# Patient Record
Sex: Female | Born: 1960
Health system: Southern US, Community
[De-identification: ages and names within clinical notes are randomized; demographics above are authoritative.]

## PROBLEM LIST (undated history)

## (undated) DIAGNOSIS — C801 Malignant (primary) neoplasm, unspecified: Secondary | ICD-10-CM

## (undated) HISTORY — PX: GALLBLADDER SURGERY: SHX652

## (undated) HISTORY — PX: CHOLECYSTECTOMY: SHX55

## (undated) HISTORY — PX: BACK SURGERY: SHX140

---

## 2006-01-23 ENCOUNTER — Ambulatory Visit: Payer: Self-pay | Admitting: Unknown Physician Specialty

## 2014-09-06 ENCOUNTER — Ambulatory Visit: Payer: Self-pay | Admitting: Gastroenterology

## 2014-12-19 LAB — SURGICAL PATHOLOGY

## 2016-08-27 ENCOUNTER — Other Ambulatory Visit: Payer: Self-pay | Admitting: Obstetrics and Gynecology

## 2016-08-27 DIAGNOSIS — Z1231 Encounter for screening mammogram for malignant neoplasm of breast: Secondary | ICD-10-CM

## 2016-09-10 ENCOUNTER — Ambulatory Visit
Admission: RE | Admit: 2016-09-10 | Discharge: 2016-09-10 | Disposition: A | Discharge status: Home / Self Care | Payer: Commercial Managed Care - PPO | Source: Ambulatory Visit | Attending: Obstetrics and Gynecology | Admitting: Obstetrics and Gynecology

## 2016-09-10 ENCOUNTER — Encounter: Payer: Self-pay | Admitting: Radiology

## 2016-09-10 DIAGNOSIS — Z1231 Encounter for screening mammogram for malignant neoplasm of breast: Secondary | ICD-10-CM | POA: Insufficient documentation

## 2016-09-11 ENCOUNTER — Ambulatory Visit: Admission: RE | Admit: 2016-09-11 | Payer: Self-pay | Source: Ambulatory Visit

## 2016-09-17 ENCOUNTER — Inpatient Hospital Stay
Admission: RE | Admit: 2016-09-17 | Discharge: 2016-09-17 | Disposition: A | Discharge status: Home / Self Care | Payer: Self-pay | Source: Ambulatory Visit | Attending: *Deleted | Admitting: *Deleted

## 2016-09-17 ENCOUNTER — Other Ambulatory Visit: Payer: Self-pay | Admitting: *Deleted

## 2016-09-17 DIAGNOSIS — Z9289 Personal history of other medical treatment: Secondary | ICD-10-CM

## 2017-02-23 ENCOUNTER — Ambulatory Visit
Admission: EM | Admit: 2017-02-23 | Discharge: 2017-02-23 | Disposition: A | Discharge status: Home / Self Care | Payer: Commercial Managed Care - PPO | Attending: Family Medicine | Admitting: Family Medicine

## 2017-02-23 DIAGNOSIS — J0100 Acute maxillary sinusitis, unspecified: Secondary | ICD-10-CM | POA: Diagnosis not present

## 2017-02-23 MED ORDER — PSEUDOEPHEDRINE HCL 60 MG PO TABS: 60.0000 mg | ORAL_TABLET | Freq: Four times a day (QID) | ORAL | 0 refills | Status: DC | PRN

## 2017-02-23 MED ORDER — LEVOFLOXACIN 500 MG PO TABS: 500.0000 mg | ORAL_TABLET | Freq: Every day | ORAL | 0 refills | Status: DC

## 2017-02-23 NOTE — Discharge Instructions (Signed)
Please continue Flonase, recommend a netti pot or nasal saline solution as needed. Discontinue augmentin, start levaquin. Take pseudoephedrine as prescribed.

## 2017-02-23 NOTE — ED Triage Notes (Signed)
Patient complains of severe pain and pressure. Patient states that symptoms started 12 days ago. Patient states that she went to the walk-in clinic on Wednesday and was Rx Augmentin. Patient states that she has worsened since. Patient reports fever, chills, pain and pressure, headache, ear pain, neck stiffness. Patient states that bending over exacerbates symptoms.

## 2017-02-23 NOTE — ED Provider Notes (Signed)
CSN: [id number]     Arrival date & time 02/23/17  1211 History   First MD Initiated Contact with Patient 02/23/17 1222     Chief Complaint  Patient presents with  . Facial Pain   (Consider location/radiation/quality/duration/timing/severity/associated sxs/prior Treatment) HPI  56 year old female presents to the emergency department for evaluation of severe sinus pain and pressure. Symptoms been present for nearly 2 weeks she describes pain and pressure along her maxillary greater than frontal sinus with intermittent chills. Pain and pressure in her face is increased with leaning forward. She has some dental pain. She denies any neck pain, headache, nausea or vomiting. She has been taking Augmentin for 4 days with no improvement. She has been using Flonase with some improvement. She has not been taking a decongestant medications.     History reviewed. No pertinent past medical history. Past Surgical History:  Procedure Laterality Date  . GALLBLADDER SURGERY     Family History  Problem Relation Age of Onset  . Breast cancer Neg Hx    Social History  Substance Use Topics  . Smoking status: Never Smoker  . Smokeless tobacco: Never Used  . Alcohol use No   OB History    No data available     Review of Systems  Constitutional: Positive for chills and fever.  HENT: Positive for congestion, sinus pain and sinus pressure. Negative for ear discharge, facial swelling, nosebleeds, sore throat and tinnitus.   Eyes: Negative for photophobia, pain, discharge, redness and itching.  Respiratory: Negative for cough, choking, chest tightness and shortness of breath.   Cardiovascular: Negative for chest pain.  Musculoskeletal: Negative for back pain and neck pain.  Skin: Negative for wound.  Neurological: Negative for dizziness, light-headedness and headaches.    Allergies  Patient has no known allergies.  Home Medications   Prior to Admission medications   Medication Sig Start Date End  Date Taking? Authorizing Provider  levofloxacin (LEVAQUIN) 500 MG tablet Take 1 tablet (500 mg total) by mouth daily. 02/23/17   Duanne Guess, PA-C  pseudoephedrine (SUDAFED) 60 MG tablet Take 1 tablet (60 mg total) by mouth every 6 (six) hours as needed for congestion. 02/23/17   Duanne Guess, PA-C   Meds Ordered and Administered this Visit  Medications - No data to display  Pulse 97   Temp 98.5 F (36.9 C) (Oral)   Resp 16   Ht 5\' 7"  (1.702 m)   Wt 143 lb (64.9 kg)   SpO2 97%   BMI 22.40 kg/m  No data found.   Physical Exam  Constitutional: She is oriented to person, place, and time. She appears well-developed and well-nourished.  HENT:  Head: Normocephalic and atraumatic.  Right Ear: External ear normal.  Left Ear: External ear normal.  Mouth/Throat: Oropharynx is clear and moist. No oropharyngeal exudate.  Positive frontal and maxillary sinus tenderness to percussion.  Eyes: Conjunctivae and EOM are normal. Pupils are equal, round, and reactive to light.  Neck: Normal range of motion.  Cardiovascular: Normal rate, regular rhythm and normal heart sounds.   Pulmonary/Chest: Effort normal and breath sounds normal. No respiratory distress. She has no wheezes.  Musculoskeletal: Normal range of motion.  Neurological: She is alert and oriented to person, place, and time.  Skin: Skin is warm. No rash noted. No erythema.  Psychiatric: She has a normal mood and affect. Her behavior is normal. Judgment and thought content normal.    Urgent Care Course     Procedures (including critical  care time)  Labs Review Labs Reviewed - No data to display  Imaging Review No results found.   Visual Acuity Review  Right Eye Distance:   Left Eye Distance:   Bilateral Distance:    Right Eye Near:   Left Eye Near:    Bilateral Near:         MDM   1. Acute non-recurrent maxillary sinusitis    56 year old female with frontal and maxillary sinusitis. She will discontinue  Augmentin and start Levaquin, it appears she is feeling first-line treatment. Recommend continuing with Flonase. Discussed use of netti-pot. She will also start over-the-counter decongestant medication. She is educated on signs and symptoms return to clinic for.   Duanne Guess, [place] 02/23/17 1257

## 2017-03-13 DIAGNOSIS — C7931 Secondary malignant neoplasm of brain: Secondary | ICD-10-CM | POA: Insufficient documentation

## 2017-03-13 DIAGNOSIS — R918 Other nonspecific abnormal finding of lung field: Secondary | ICD-10-CM | POA: Insufficient documentation

## 2017-03-20 DIAGNOSIS — C3492 Malignant neoplasm of unspecified part of left bronchus or lung: Secondary | ICD-10-CM | POA: Insufficient documentation

## 2017-04-24 DIAGNOSIS — Z5189 Encounter for other specified aftercare: Secondary | ICD-10-CM | POA: Insufficient documentation

## 2017-04-27 DIAGNOSIS — K754 Autoimmune hepatitis: Secondary | ICD-10-CM | POA: Insufficient documentation

## 2017-04-27 DIAGNOSIS — R7401 Elevation of levels of liver transaminase levels: Secondary | ICD-10-CM | POA: Insufficient documentation

## 2017-05-15 DIAGNOSIS — R600 Localized edema: Secondary | ICD-10-CM | POA: Insufficient documentation

## 2017-05-15 DIAGNOSIS — R609 Edema, unspecified: Secondary | ICD-10-CM | POA: Insufficient documentation

## 2017-08-27 DIAGNOSIS — C3490 Malignant neoplasm of unspecified part of unspecified bronchus or lung: Secondary | ICD-10-CM | POA: Diagnosis not present

## 2017-08-27 DIAGNOSIS — C7931 Secondary malignant neoplasm of brain: Secondary | ICD-10-CM | POA: Diagnosis not present

## 2017-08-27 DIAGNOSIS — Z5112 Encounter for antineoplastic immunotherapy: Secondary | ICD-10-CM | POA: Diagnosis not present

## 2017-08-27 DIAGNOSIS — Z5111 Encounter for antineoplastic chemotherapy: Secondary | ICD-10-CM | POA: Diagnosis not present

## 2017-08-27 DIAGNOSIS — C3492 Malignant neoplasm of unspecified part of left bronchus or lung: Secondary | ICD-10-CM | POA: Diagnosis not present

## 2017-08-28 DIAGNOSIS — C7931 Secondary malignant neoplasm of brain: Secondary | ICD-10-CM | POA: Diagnosis not present

## 2017-08-28 DIAGNOSIS — C3492 Malignant neoplasm of unspecified part of left bronchus or lung: Secondary | ICD-10-CM | POA: Diagnosis not present

## 2017-08-28 DIAGNOSIS — C3432 Malignant neoplasm of lower lobe, left bronchus or lung: Secondary | ICD-10-CM | POA: Diagnosis not present

## 2017-08-28 DIAGNOSIS — Z5111 Encounter for antineoplastic chemotherapy: Secondary | ICD-10-CM | POA: Diagnosis not present

## 2017-08-31 DIAGNOSIS — R197 Diarrhea, unspecified: Secondary | ICD-10-CM | POA: Diagnosis not present

## 2017-08-31 DIAGNOSIS — M6281 Muscle weakness (generalized): Secondary | ICD-10-CM | POA: Diagnosis not present

## 2017-08-31 DIAGNOSIS — R110 Nausea: Secondary | ICD-10-CM | POA: Diagnosis not present

## 2017-09-01 DIAGNOSIS — E878 Other disorders of electrolyte and fluid balance, not elsewhere classified: Secondary | ICD-10-CM | POA: Diagnosis not present

## 2017-09-01 DIAGNOSIS — R109 Unspecified abdominal pain: Secondary | ICD-10-CM | POA: Diagnosis not present

## 2017-09-01 DIAGNOSIS — A419 Sepsis, unspecified organism: Secondary | ICD-10-CM | POA: Diagnosis not present

## 2017-09-01 DIAGNOSIS — C7931 Secondary malignant neoplasm of brain: Secondary | ICD-10-CM | POA: Diagnosis not present

## 2017-09-01 DIAGNOSIS — K5792 Diverticulitis of intestine, part unspecified, without perforation or abscess without bleeding: Secondary | ICD-10-CM | POA: Diagnosis not present

## 2017-09-01 DIAGNOSIS — R112 Nausea with vomiting, unspecified: Secondary | ICD-10-CM | POA: Diagnosis not present

## 2017-09-01 DIAGNOSIS — R6510 Systemic inflammatory response syndrome (SIRS) of non-infectious origin without acute organ dysfunction: Secondary | ICD-10-CM | POA: Diagnosis not present

## 2017-09-01 DIAGNOSIS — K5732 Diverticulitis of large intestine without perforation or abscess without bleeding: Secondary | ICD-10-CM | POA: Diagnosis not present

## 2017-09-01 DIAGNOSIS — T451X5A Adverse effect of antineoplastic and immunosuppressive drugs, initial encounter: Secondary | ICD-10-CM | POA: Diagnosis not present

## 2017-09-01 DIAGNOSIS — C3492 Malignant neoplasm of unspecified part of left bronchus or lung: Secondary | ICD-10-CM | POA: Diagnosis not present

## 2017-09-11 DIAGNOSIS — E876 Hypokalemia: Secondary | ICD-10-CM | POA: Diagnosis not present

## 2017-09-11 DIAGNOSIS — E860 Dehydration: Secondary | ICD-10-CM | POA: Diagnosis not present

## 2017-09-11 DIAGNOSIS — R197 Diarrhea, unspecified: Secondary | ICD-10-CM | POA: Diagnosis not present

## 2017-09-12 DIAGNOSIS — E860 Dehydration: Secondary | ICD-10-CM | POA: Diagnosis not present

## 2017-09-12 DIAGNOSIS — E876 Hypokalemia: Secondary | ICD-10-CM | POA: Diagnosis not present

## 2017-09-12 DIAGNOSIS — R197 Diarrhea, unspecified: Secondary | ICD-10-CM | POA: Diagnosis not present

## 2017-09-18 DIAGNOSIS — C3492 Malignant neoplasm of unspecified part of left bronchus or lung: Secondary | ICD-10-CM | POA: Diagnosis not present

## 2017-09-18 DIAGNOSIS — C3432 Malignant neoplasm of lower lobe, left bronchus or lung: Secondary | ICD-10-CM | POA: Diagnosis not present

## 2017-09-18 DIAGNOSIS — R600 Localized edema: Secondary | ICD-10-CM | POA: Diagnosis not present

## 2017-09-18 DIAGNOSIS — C7931 Secondary malignant neoplasm of brain: Secondary | ICD-10-CM | POA: Diagnosis not present

## 2017-09-25 DIAGNOSIS — Z79899 Other long term (current) drug therapy: Secondary | ICD-10-CM | POA: Diagnosis not present

## 2017-09-25 DIAGNOSIS — R319 Hematuria, unspecified: Secondary | ICD-10-CM | POA: Diagnosis not present

## 2017-09-25 DIAGNOSIS — M1990 Unspecified osteoarthritis, unspecified site: Secondary | ICD-10-CM | POA: Diagnosis not present

## 2017-09-25 DIAGNOSIS — C3492 Malignant neoplasm of unspecified part of left bronchus or lung: Secondary | ICD-10-CM | POA: Diagnosis not present

## 2017-10-09 DIAGNOSIS — R1084 Generalized abdominal pain: Secondary | ICD-10-CM | POA: Diagnosis not present

## 2017-10-09 DIAGNOSIS — R531 Weakness: Secondary | ICD-10-CM | POA: Diagnosis not present

## 2017-10-09 DIAGNOSIS — C3492 Malignant neoplasm of unspecified part of left bronchus or lung: Secondary | ICD-10-CM | POA: Diagnosis not present

## 2017-10-09 DIAGNOSIS — C3432 Malignant neoplasm of lower lobe, left bronchus or lung: Secondary | ICD-10-CM | POA: Diagnosis not present

## 2017-10-09 DIAGNOSIS — Z5111 Encounter for antineoplastic chemotherapy: Secondary | ICD-10-CM | POA: Diagnosis not present

## 2017-10-09 DIAGNOSIS — C7931 Secondary malignant neoplasm of brain: Secondary | ICD-10-CM | POA: Diagnosis not present

## 2017-10-13 DIAGNOSIS — R61 Generalized hyperhidrosis: Secondary | ICD-10-CM | POA: Diagnosis not present

## 2017-10-13 DIAGNOSIS — C3490 Malignant neoplasm of unspecified part of unspecified bronchus or lung: Secondary | ICD-10-CM | POA: Diagnosis not present

## 2017-10-13 DIAGNOSIS — R232 Flushing: Secondary | ICD-10-CM | POA: Diagnosis not present

## 2017-10-17 DIAGNOSIS — Z85118 Personal history of other malignant neoplasm of bronchus and lung: Secondary | ICD-10-CM | POA: Diagnosis not present

## 2017-10-17 DIAGNOSIS — C7931 Secondary malignant neoplasm of brain: Secondary | ICD-10-CM | POA: Diagnosis not present

## 2017-10-17 DIAGNOSIS — Z923 Personal history of irradiation: Secondary | ICD-10-CM | POA: Diagnosis not present

## 2017-10-23 DIAGNOSIS — N362 Urethral caruncle: Secondary | ICD-10-CM | POA: Diagnosis not present

## 2017-10-23 DIAGNOSIS — R319 Hematuria, unspecified: Secondary | ICD-10-CM | POA: Diagnosis not present

## 2017-10-23 DIAGNOSIS — R3129 Other microscopic hematuria: Secondary | ICD-10-CM | POA: Diagnosis not present

## 2017-10-23 DIAGNOSIS — K5900 Constipation, unspecified: Secondary | ICD-10-CM | POA: Diagnosis not present

## 2017-10-30 DIAGNOSIS — C3432 Malignant neoplasm of lower lobe, left bronchus or lung: Secondary | ICD-10-CM | POA: Diagnosis not present

## 2017-10-30 DIAGNOSIS — R1084 Generalized abdominal pain: Secondary | ICD-10-CM | POA: Diagnosis not present

## 2017-10-30 DIAGNOSIS — C3492 Malignant neoplasm of unspecified part of left bronchus or lung: Secondary | ICD-10-CM | POA: Diagnosis not present

## 2017-10-30 DIAGNOSIS — C7931 Secondary malignant neoplasm of brain: Secondary | ICD-10-CM | POA: Diagnosis not present

## 2017-10-30 DIAGNOSIS — R911 Solitary pulmonary nodule: Secondary | ICD-10-CM | POA: Diagnosis not present

## 2017-10-30 DIAGNOSIS — G893 Neoplasm related pain (acute) (chronic): Secondary | ICD-10-CM | POA: Diagnosis not present

## 2017-10-30 DIAGNOSIS — R600 Localized edema: Secondary | ICD-10-CM | POA: Diagnosis not present

## 2017-10-30 DIAGNOSIS — K5732 Diverticulitis of large intestine without perforation or abscess without bleeding: Secondary | ICD-10-CM | POA: Diagnosis not present

## 2017-10-30 DIAGNOSIS — Z87891 Personal history of nicotine dependence: Secondary | ICD-10-CM | POA: Diagnosis not present

## 2017-10-30 DIAGNOSIS — K5792 Diverticulitis of intestine, part unspecified, without perforation or abscess without bleeding: Secondary | ICD-10-CM | POA: Diagnosis not present

## 2017-11-13 DIAGNOSIS — K5904 Chronic idiopathic constipation: Secondary | ICD-10-CM | POA: Diagnosis not present

## 2017-11-13 DIAGNOSIS — R232 Flushing: Secondary | ICD-10-CM | POA: Diagnosis not present

## 2017-11-13 DIAGNOSIS — C3490 Malignant neoplasm of unspecified part of unspecified bronchus or lung: Secondary | ICD-10-CM | POA: Diagnosis not present

## 2017-12-01 DIAGNOSIS — M940 Chondrocostal junction syndrome [Tietze]: Secondary | ICD-10-CM | POA: Diagnosis not present

## 2017-12-01 DIAGNOSIS — S39012A Strain of muscle, fascia and tendon of lower back, initial encounter: Secondary | ICD-10-CM | POA: Diagnosis not present

## 2017-12-09 DIAGNOSIS — R1110 Vomiting, unspecified: Secondary | ICD-10-CM | POA: Diagnosis not present

## 2017-12-09 DIAGNOSIS — R109 Unspecified abdominal pain: Secondary | ICD-10-CM | POA: Diagnosis not present

## 2017-12-09 DIAGNOSIS — K5792 Diverticulitis of intestine, part unspecified, without perforation or abscess without bleeding: Secondary | ICD-10-CM | POA: Diagnosis not present

## 2017-12-12 DIAGNOSIS — C3492 Malignant neoplasm of unspecified part of left bronchus or lung: Secondary | ICD-10-CM | POA: Diagnosis not present

## 2017-12-15 DIAGNOSIS — K5904 Chronic idiopathic constipation: Secondary | ICD-10-CM | POA: Diagnosis not present

## 2017-12-15 DIAGNOSIS — R232 Flushing: Secondary | ICD-10-CM | POA: Diagnosis not present

## 2017-12-25 DIAGNOSIS — M1990 Unspecified osteoarthritis, unspecified site: Secondary | ICD-10-CM | POA: Diagnosis not present

## 2017-12-25 DIAGNOSIS — Z79899 Other long term (current) drug therapy: Secondary | ICD-10-CM | POA: Diagnosis not present

## 2017-12-25 DIAGNOSIS — C3492 Malignant neoplasm of unspecified part of left bronchus or lung: Secondary | ICD-10-CM | POA: Diagnosis not present

## 2018-01-16 DIAGNOSIS — C3490 Malignant neoplasm of unspecified part of unspecified bronchus or lung: Secondary | ICD-10-CM | POA: Diagnosis not present

## 2018-01-16 DIAGNOSIS — C7931 Secondary malignant neoplasm of brain: Secondary | ICD-10-CM | POA: Diagnosis not present

## 2018-01-29 DIAGNOSIS — C7931 Secondary malignant neoplasm of brain: Secondary | ICD-10-CM | POA: Diagnosis not present

## 2018-01-29 DIAGNOSIS — C3432 Malignant neoplasm of lower lobe, left bronchus or lung: Secondary | ICD-10-CM | POA: Diagnosis not present

## 2018-01-29 DIAGNOSIS — R911 Solitary pulmonary nodule: Secondary | ICD-10-CM | POA: Diagnosis not present

## 2018-01-29 DIAGNOSIS — C3492 Malignant neoplasm of unspecified part of left bronchus or lung: Secondary | ICD-10-CM | POA: Diagnosis not present

## 2018-01-29 DIAGNOSIS — M8580 Other specified disorders of bone density and structure, unspecified site: Secondary | ICD-10-CM | POA: Diagnosis not present

## 2018-01-29 DIAGNOSIS — G629 Polyneuropathy, unspecified: Secondary | ICD-10-CM | POA: Diagnosis not present

## 2018-02-09 DIAGNOSIS — R232 Flushing: Secondary | ICD-10-CM | POA: Diagnosis not present

## 2018-02-09 DIAGNOSIS — R61 Generalized hyperhidrosis: Secondary | ICD-10-CM | POA: Diagnosis not present

## 2018-02-09 DIAGNOSIS — N898 Other specified noninflammatory disorders of vagina: Secondary | ICD-10-CM | POA: Diagnosis not present

## 2018-02-19 DIAGNOSIS — K5904 Chronic idiopathic constipation: Secondary | ICD-10-CM | POA: Diagnosis not present

## 2018-02-19 DIAGNOSIS — E538 Deficiency of other specified B group vitamins: Secondary | ICD-10-CM | POA: Diagnosis not present

## 2018-02-19 DIAGNOSIS — R232 Flushing: Secondary | ICD-10-CM | POA: Diagnosis not present

## 2018-03-13 DIAGNOSIS — C3490 Malignant neoplasm of unspecified part of unspecified bronchus or lung: Secondary | ICD-10-CM | POA: Diagnosis not present

## 2018-03-13 DIAGNOSIS — Z452 Encounter for adjustment and management of vascular access device: Secondary | ICD-10-CM | POA: Diagnosis not present

## 2018-03-13 DIAGNOSIS — R0789 Other chest pain: Secondary | ICD-10-CM | POA: Diagnosis not present

## 2018-03-13 DIAGNOSIS — T82848A Pain from vascular prosthetic devices, implants and grafts, initial encounter: Secondary | ICD-10-CM | POA: Diagnosis not present

## 2018-03-18 DIAGNOSIS — H01009 Unspecified blepharitis unspecified eye, unspecified eyelid: Secondary | ICD-10-CM | POA: Diagnosis not present

## 2018-04-02 DIAGNOSIS — M064 Inflammatory polyarthropathy: Secondary | ICD-10-CM | POA: Diagnosis not present

## 2018-04-02 DIAGNOSIS — C3490 Malignant neoplasm of unspecified part of unspecified bronchus or lung: Secondary | ICD-10-CM | POA: Diagnosis not present

## 2018-04-02 DIAGNOSIS — Z23 Encounter for immunization: Secondary | ICD-10-CM | POA: Diagnosis not present

## 2018-04-02 DIAGNOSIS — T451X5D Adverse effect of antineoplastic and immunosuppressive drugs, subsequent encounter: Secondary | ICD-10-CM | POA: Diagnosis not present

## 2018-04-02 DIAGNOSIS — M7050 Other bursitis of knee, unspecified knee: Secondary | ICD-10-CM | POA: Insufficient documentation

## 2018-04-02 DIAGNOSIS — M1990 Unspecified osteoarthritis, unspecified site: Secondary | ICD-10-CM | POA: Diagnosis not present

## 2018-04-02 DIAGNOSIS — Z5111 Encounter for antineoplastic chemotherapy: Secondary | ICD-10-CM | POA: Diagnosis not present

## 2018-04-29 DIAGNOSIS — H01009 Unspecified blepharitis unspecified eye, unspecified eyelid: Secondary | ICD-10-CM | POA: Diagnosis not present

## 2018-05-07 DIAGNOSIS — M546 Pain in thoracic spine: Secondary | ICD-10-CM | POA: Diagnosis not present

## 2018-05-07 DIAGNOSIS — C7931 Secondary malignant neoplasm of brain: Secondary | ICD-10-CM | POA: Diagnosis not present

## 2018-05-07 DIAGNOSIS — R918 Other nonspecific abnormal finding of lung field: Secondary | ICD-10-CM | POA: Diagnosis not present

## 2018-05-07 DIAGNOSIS — C3490 Malignant neoplasm of unspecified part of unspecified bronchus or lung: Secondary | ICD-10-CM | POA: Diagnosis not present

## 2018-05-07 DIAGNOSIS — C3492 Malignant neoplasm of unspecified part of left bronchus or lung: Secondary | ICD-10-CM | POA: Diagnosis not present

## 2018-05-07 DIAGNOSIS — Z5111 Encounter for antineoplastic chemotherapy: Secondary | ICD-10-CM | POA: Diagnosis not present

## 2018-05-08 DIAGNOSIS — M546 Pain in thoracic spine: Secondary | ICD-10-CM | POA: Diagnosis not present

## 2018-05-08 DIAGNOSIS — R110 Nausea: Secondary | ICD-10-CM | POA: Diagnosis not present

## 2018-05-13 DIAGNOSIS — C3490 Malignant neoplasm of unspecified part of unspecified bronchus or lung: Secondary | ICD-10-CM | POA: Diagnosis not present

## 2018-05-13 DIAGNOSIS — R6889 Other general symptoms and signs: Secondary | ICD-10-CM | POA: Diagnosis not present

## 2018-05-13 DIAGNOSIS — C7931 Secondary malignant neoplasm of brain: Secondary | ICD-10-CM | POA: Diagnosis not present

## 2018-05-13 DIAGNOSIS — S22009A Unspecified fracture of unspecified thoracic vertebra, initial encounter for closed fracture: Secondary | ICD-10-CM | POA: Diagnosis not present

## 2018-05-13 DIAGNOSIS — M4854XD Collapsed vertebra, not elsewhere classified, thoracic region, subsequent encounter for fracture with routine healing: Secondary | ICD-10-CM | POA: Diagnosis not present

## 2018-05-13 DIAGNOSIS — C7951 Secondary malignant neoplasm of bone: Secondary | ICD-10-CM | POA: Diagnosis not present

## 2018-05-13 DIAGNOSIS — R109 Unspecified abdominal pain: Secondary | ICD-10-CM | POA: Diagnosis not present

## 2018-05-13 DIAGNOSIS — M5440 Lumbago with sciatica, unspecified side: Secondary | ICD-10-CM | POA: Diagnosis not present

## 2018-05-14 DIAGNOSIS — C7931 Secondary malignant neoplasm of brain: Secondary | ICD-10-CM | POA: Diagnosis not present

## 2018-05-14 DIAGNOSIS — C3490 Malignant neoplasm of unspecified part of unspecified bronchus or lung: Secondary | ICD-10-CM | POA: Diagnosis not present

## 2018-05-19 DIAGNOSIS — C7931 Secondary malignant neoplasm of brain: Secondary | ICD-10-CM | POA: Diagnosis not present

## 2018-05-19 DIAGNOSIS — Z85118 Personal history of other malignant neoplasm of bronchus and lung: Secondary | ICD-10-CM | POA: Diagnosis not present

## 2018-05-19 DIAGNOSIS — J910 Malignant pleural effusion: Secondary | ICD-10-CM | POA: Diagnosis not present

## 2018-05-19 DIAGNOSIS — C3490 Malignant neoplasm of unspecified part of unspecified bronchus or lung: Secondary | ICD-10-CM | POA: Diagnosis not present

## 2018-05-19 DIAGNOSIS — J90 Pleural effusion, not elsewhere classified: Secondary | ICD-10-CM | POA: Diagnosis not present

## 2018-05-19 DIAGNOSIS — R918 Other nonspecific abnormal finding of lung field: Secondary | ICD-10-CM | POA: Diagnosis not present

## 2018-05-19 DIAGNOSIS — C3492 Malignant neoplasm of unspecified part of left bronchus or lung: Secondary | ICD-10-CM | POA: Diagnosis not present

## 2018-05-19 DIAGNOSIS — R911 Solitary pulmonary nodule: Secondary | ICD-10-CM | POA: Diagnosis not present

## 2018-05-20 DIAGNOSIS — M546 Pain in thoracic spine: Secondary | ICD-10-CM | POA: Diagnosis not present

## 2018-05-20 DIAGNOSIS — C3492 Malignant neoplasm of unspecified part of left bronchus or lung: Secondary | ICD-10-CM | POA: Diagnosis not present

## 2018-05-20 DIAGNOSIS — C7931 Secondary malignant neoplasm of brain: Secondary | ICD-10-CM | POA: Diagnosis not present

## 2018-05-20 DIAGNOSIS — C3490 Malignant neoplasm of unspecified part of unspecified bronchus or lung: Secondary | ICD-10-CM | POA: Diagnosis not present

## 2018-05-22 DIAGNOSIS — Z923 Personal history of irradiation: Secondary | ICD-10-CM | POA: Diagnosis not present

## 2018-05-22 DIAGNOSIS — M549 Dorsalgia, unspecified: Secondary | ICD-10-CM | POA: Diagnosis not present

## 2018-05-22 DIAGNOSIS — C3490 Malignant neoplasm of unspecified part of unspecified bronchus or lung: Secondary | ICD-10-CM | POA: Diagnosis not present

## 2018-05-22 DIAGNOSIS — J90 Pleural effusion, not elsewhere classified: Secondary | ICD-10-CM | POA: Diagnosis not present

## 2018-05-22 DIAGNOSIS — D649 Anemia, unspecified: Secondary | ICD-10-CM | POA: Diagnosis not present

## 2018-05-22 DIAGNOSIS — S22081A Stable burst fracture of T11-T12 vertebra, initial encounter for closed fracture: Secondary | ICD-10-CM | POA: Diagnosis not present

## 2018-05-22 DIAGNOSIS — C7931 Secondary malignant neoplasm of brain: Secondary | ICD-10-CM | POA: Diagnosis not present

## 2018-05-22 DIAGNOSIS — C801 Malignant (primary) neoplasm, unspecified: Secondary | ICD-10-CM | POA: Diagnosis not present

## 2018-05-29 DIAGNOSIS — C3490 Malignant neoplasm of unspecified part of unspecified bronchus or lung: Secondary | ICD-10-CM | POA: Diagnosis not present

## 2018-05-29 DIAGNOSIS — S22000A Wedge compression fracture of unspecified thoracic vertebra, initial encounter for closed fracture: Secondary | ICD-10-CM | POA: Diagnosis not present

## 2018-05-29 DIAGNOSIS — M546 Pain in thoracic spine: Secondary | ICD-10-CM | POA: Diagnosis not present

## 2018-05-29 DIAGNOSIS — S22080A Wedge compression fracture of T11-T12 vertebra, initial encounter for closed fracture: Secondary | ICD-10-CM | POA: Diagnosis not present

## 2018-05-29 DIAGNOSIS — C7931 Secondary malignant neoplasm of brain: Secondary | ICD-10-CM | POA: Diagnosis not present

## 2018-06-02 DIAGNOSIS — S22088A Other fracture of T11-T12 vertebra, initial encounter for closed fracture: Secondary | ICD-10-CM | POA: Diagnosis not present

## 2018-06-02 DIAGNOSIS — S22080G Wedge compression fracture of T11-T12 vertebra, subsequent encounter for fracture with delayed healing: Secondary | ICD-10-CM | POA: Diagnosis not present

## 2018-06-02 DIAGNOSIS — Z87891 Personal history of nicotine dependence: Secondary | ICD-10-CM | POA: Diagnosis not present

## 2018-06-02 DIAGNOSIS — S22080A Wedge compression fracture of T11-T12 vertebra, initial encounter for closed fracture: Secondary | ICD-10-CM | POA: Diagnosis not present

## 2018-06-05 DIAGNOSIS — R0781 Pleurodynia: Secondary | ICD-10-CM | POA: Diagnosis not present

## 2018-06-05 DIAGNOSIS — R110 Nausea: Secondary | ICD-10-CM | POA: Diagnosis not present

## 2018-06-05 DIAGNOSIS — M8580 Other specified disorders of bone density and structure, unspecified site: Secondary | ICD-10-CM | POA: Diagnosis not present

## 2018-06-08 DIAGNOSIS — G893 Neoplasm related pain (acute) (chronic): Secondary | ICD-10-CM | POA: Diagnosis not present

## 2018-06-08 DIAGNOSIS — E860 Dehydration: Secondary | ICD-10-CM | POA: Diagnosis not present

## 2018-06-08 DIAGNOSIS — R112 Nausea with vomiting, unspecified: Secondary | ICD-10-CM | POA: Diagnosis not present

## 2018-06-08 DIAGNOSIS — C7931 Secondary malignant neoplasm of brain: Secondary | ICD-10-CM | POA: Diagnosis not present

## 2018-06-08 DIAGNOSIS — M546 Pain in thoracic spine: Secondary | ICD-10-CM | POA: Diagnosis not present

## 2018-06-08 DIAGNOSIS — C3492 Malignant neoplasm of unspecified part of left bronchus or lung: Secondary | ICD-10-CM | POA: Diagnosis not present

## 2018-06-15 DIAGNOSIS — C7951 Secondary malignant neoplasm of bone: Secondary | ICD-10-CM | POA: Diagnosis not present

## 2018-06-15 DIAGNOSIS — Z8719 Personal history of other diseases of the digestive system: Secondary | ICD-10-CM | POA: Insufficient documentation

## 2018-06-15 DIAGNOSIS — E41 Nutritional marasmus: Secondary | ICD-10-CM | POA: Diagnosis not present

## 2018-06-15 DIAGNOSIS — S22000A Wedge compression fracture of unspecified thoracic vertebra, initial encounter for closed fracture: Secondary | ICD-10-CM | POA: Diagnosis not present

## 2018-06-18 DIAGNOSIS — I10 Essential (primary) hypertension: Secondary | ICD-10-CM | POA: Diagnosis not present

## 2018-06-18 DIAGNOSIS — C3492 Malignant neoplasm of unspecified part of left bronchus or lung: Secondary | ICD-10-CM | POA: Diagnosis not present

## 2018-06-18 DIAGNOSIS — C7931 Secondary malignant neoplasm of brain: Secondary | ICD-10-CM | POA: Diagnosis not present

## 2018-06-19 DIAGNOSIS — C7931 Secondary malignant neoplasm of brain: Secondary | ICD-10-CM | POA: Diagnosis not present

## 2018-06-19 DIAGNOSIS — C3492 Malignant neoplasm of unspecified part of left bronchus or lung: Secondary | ICD-10-CM | POA: Diagnosis not present

## 2018-06-19 DIAGNOSIS — R112 Nausea with vomiting, unspecified: Secondary | ICD-10-CM | POA: Diagnosis not present

## 2018-06-19 DIAGNOSIS — I10 Essential (primary) hypertension: Secondary | ICD-10-CM | POA: Diagnosis not present

## 2018-06-19 DIAGNOSIS — M546 Pain in thoracic spine: Secondary | ICD-10-CM | POA: Diagnosis not present

## 2018-06-19 DIAGNOSIS — Z08 Encounter for follow-up examination after completed treatment for malignant neoplasm: Secondary | ICD-10-CM | POA: Diagnosis not present

## 2018-06-20 DIAGNOSIS — C3492 Malignant neoplasm of unspecified part of left bronchus or lung: Secondary | ICD-10-CM | POA: Diagnosis not present

## 2018-06-20 DIAGNOSIS — I10 Essential (primary) hypertension: Secondary | ICD-10-CM | POA: Diagnosis not present

## 2018-06-20 DIAGNOSIS — C7931 Secondary malignant neoplasm of brain: Secondary | ICD-10-CM | POA: Diagnosis not present

## 2018-06-21 DIAGNOSIS — C3492 Malignant neoplasm of unspecified part of left bronchus or lung: Secondary | ICD-10-CM | POA: Diagnosis not present

## 2018-06-21 DIAGNOSIS — C7931 Secondary malignant neoplasm of brain: Secondary | ICD-10-CM | POA: Diagnosis not present

## 2018-06-21 DIAGNOSIS — I10 Essential (primary) hypertension: Secondary | ICD-10-CM | POA: Diagnosis not present

## 2018-06-22 DIAGNOSIS — C3492 Malignant neoplasm of unspecified part of left bronchus or lung: Secondary | ICD-10-CM | POA: Diagnosis not present

## 2018-06-22 DIAGNOSIS — C7931 Secondary malignant neoplasm of brain: Secondary | ICD-10-CM | POA: Diagnosis not present

## 2018-06-22 DIAGNOSIS — I10 Essential (primary) hypertension: Secondary | ICD-10-CM | POA: Diagnosis not present

## 2018-06-23 DIAGNOSIS — I10 Essential (primary) hypertension: Secondary | ICD-10-CM | POA: Diagnosis not present

## 2018-06-23 DIAGNOSIS — C7931 Secondary malignant neoplasm of brain: Secondary | ICD-10-CM | POA: Diagnosis not present

## 2018-06-23 DIAGNOSIS — C3492 Malignant neoplasm of unspecified part of left bronchus or lung: Secondary | ICD-10-CM | POA: Diagnosis not present

## 2018-06-24 DIAGNOSIS — C3492 Malignant neoplasm of unspecified part of left bronchus or lung: Secondary | ICD-10-CM | POA: Diagnosis not present

## 2018-06-24 DIAGNOSIS — C7931 Secondary malignant neoplasm of brain: Secondary | ICD-10-CM | POA: Diagnosis not present

## 2018-06-24 DIAGNOSIS — I10 Essential (primary) hypertension: Secondary | ICD-10-CM | POA: Diagnosis not present

## 2018-06-25 DIAGNOSIS — I10 Essential (primary) hypertension: Secondary | ICD-10-CM | POA: Diagnosis not present

## 2018-06-25 DIAGNOSIS — C3492 Malignant neoplasm of unspecified part of left bronchus or lung: Secondary | ICD-10-CM | POA: Diagnosis not present

## 2018-06-25 DIAGNOSIS — C7931 Secondary malignant neoplasm of brain: Secondary | ICD-10-CM | POA: Diagnosis not present

## 2018-06-26 DIAGNOSIS — C7931 Secondary malignant neoplasm of brain: Secondary | ICD-10-CM | POA: Diagnosis not present

## 2018-06-26 DIAGNOSIS — I10 Essential (primary) hypertension: Secondary | ICD-10-CM | POA: Diagnosis not present

## 2018-06-26 DIAGNOSIS — C3492 Malignant neoplasm of unspecified part of left bronchus or lung: Secondary | ICD-10-CM | POA: Diagnosis not present

## 2018-06-27 DIAGNOSIS — C3492 Malignant neoplasm of unspecified part of left bronchus or lung: Secondary | ICD-10-CM | POA: Diagnosis not present

## 2018-06-27 DIAGNOSIS — C7931 Secondary malignant neoplasm of brain: Secondary | ICD-10-CM | POA: Diagnosis not present

## 2018-06-27 DIAGNOSIS — I10 Essential (primary) hypertension: Secondary | ICD-10-CM | POA: Diagnosis not present

## 2018-06-28 DIAGNOSIS — C7931 Secondary malignant neoplasm of brain: Secondary | ICD-10-CM | POA: Diagnosis not present

## 2018-06-28 DIAGNOSIS — I10 Essential (primary) hypertension: Secondary | ICD-10-CM | POA: Diagnosis not present

## 2018-06-28 DIAGNOSIS — C3492 Malignant neoplasm of unspecified part of left bronchus or lung: Secondary | ICD-10-CM | POA: Diagnosis not present

## 2018-06-29 DIAGNOSIS — C7931 Secondary malignant neoplasm of brain: Secondary | ICD-10-CM | POA: Diagnosis not present

## 2018-06-29 DIAGNOSIS — C3492 Malignant neoplasm of unspecified part of left bronchus or lung: Secondary | ICD-10-CM | POA: Diagnosis not present

## 2018-06-29 DIAGNOSIS — I10 Essential (primary) hypertension: Secondary | ICD-10-CM | POA: Diagnosis not present

## 2018-06-30 DIAGNOSIS — C7931 Secondary malignant neoplasm of brain: Secondary | ICD-10-CM | POA: Diagnosis not present

## 2018-06-30 DIAGNOSIS — I10 Essential (primary) hypertension: Secondary | ICD-10-CM | POA: Diagnosis not present

## 2018-06-30 DIAGNOSIS — C3492 Malignant neoplasm of unspecified part of left bronchus or lung: Secondary | ICD-10-CM | POA: Diagnosis not present

## 2018-07-01 DIAGNOSIS — G893 Neoplasm related pain (acute) (chronic): Secondary | ICD-10-CM | POA: Diagnosis not present

## 2018-07-01 DIAGNOSIS — C3492 Malignant neoplasm of unspecified part of left bronchus or lung: Secondary | ICD-10-CM | POA: Diagnosis not present

## 2018-07-01 DIAGNOSIS — C7931 Secondary malignant neoplasm of brain: Secondary | ICD-10-CM | POA: Diagnosis not present

## 2018-07-01 DIAGNOSIS — R110 Nausea: Secondary | ICD-10-CM | POA: Diagnosis not present

## 2018-07-01 DIAGNOSIS — I10 Essential (primary) hypertension: Secondary | ICD-10-CM | POA: Diagnosis not present

## 2018-07-02 DIAGNOSIS — I10 Essential (primary) hypertension: Secondary | ICD-10-CM | POA: Diagnosis not present

## 2018-07-02 DIAGNOSIS — C3492 Malignant neoplasm of unspecified part of left bronchus or lung: Secondary | ICD-10-CM | POA: Diagnosis not present

## 2018-07-02 DIAGNOSIS — C7931 Secondary malignant neoplasm of brain: Secondary | ICD-10-CM | POA: Diagnosis not present

## 2018-07-03 DIAGNOSIS — I10 Essential (primary) hypertension: Secondary | ICD-10-CM | POA: Diagnosis not present

## 2018-07-03 DIAGNOSIS — C3492 Malignant neoplasm of unspecified part of left bronchus or lung: Secondary | ICD-10-CM | POA: Diagnosis not present

## 2018-07-03 DIAGNOSIS — C7931 Secondary malignant neoplasm of brain: Secondary | ICD-10-CM | POA: Diagnosis not present

## 2018-07-04 DIAGNOSIS — I10 Essential (primary) hypertension: Secondary | ICD-10-CM | POA: Diagnosis not present

## 2018-07-04 DIAGNOSIS — C7931 Secondary malignant neoplasm of brain: Secondary | ICD-10-CM | POA: Diagnosis not present

## 2018-07-04 DIAGNOSIS — C3492 Malignant neoplasm of unspecified part of left bronchus or lung: Secondary | ICD-10-CM | POA: Diagnosis not present

## 2018-07-05 DIAGNOSIS — I10 Essential (primary) hypertension: Secondary | ICD-10-CM | POA: Diagnosis not present

## 2018-07-05 DIAGNOSIS — C7931 Secondary malignant neoplasm of brain: Secondary | ICD-10-CM | POA: Diagnosis not present

## 2018-07-05 DIAGNOSIS — C3492 Malignant neoplasm of unspecified part of left bronchus or lung: Secondary | ICD-10-CM | POA: Diagnosis not present

## 2018-07-06 DIAGNOSIS — C3492 Malignant neoplasm of unspecified part of left bronchus or lung: Secondary | ICD-10-CM | POA: Diagnosis not present

## 2018-07-06 DIAGNOSIS — C7931 Secondary malignant neoplasm of brain: Secondary | ICD-10-CM | POA: Diagnosis not present

## 2018-07-06 DIAGNOSIS — I10 Essential (primary) hypertension: Secondary | ICD-10-CM | POA: Diagnosis not present

## 2018-07-06 DIAGNOSIS — M545 Low back pain: Secondary | ICD-10-CM | POA: Diagnosis not present

## 2018-07-06 DIAGNOSIS — M5441 Lumbago with sciatica, right side: Secondary | ICD-10-CM | POA: Diagnosis not present

## 2018-07-06 DIAGNOSIS — R262 Difficulty in walking, not elsewhere classified: Secondary | ICD-10-CM | POA: Diagnosis not present

## 2018-07-07 DIAGNOSIS — C3492 Malignant neoplasm of unspecified part of left bronchus or lung: Secondary | ICD-10-CM | POA: Diagnosis not present

## 2018-07-07 DIAGNOSIS — I10 Essential (primary) hypertension: Secondary | ICD-10-CM | POA: Diagnosis not present

## 2018-07-07 DIAGNOSIS — C7931 Secondary malignant neoplasm of brain: Secondary | ICD-10-CM | POA: Diagnosis not present

## 2018-07-08 DIAGNOSIS — I10 Essential (primary) hypertension: Secondary | ICD-10-CM | POA: Diagnosis not present

## 2018-07-08 DIAGNOSIS — C7931 Secondary malignant neoplasm of brain: Secondary | ICD-10-CM | POA: Diagnosis not present

## 2018-07-08 DIAGNOSIS — C3492 Malignant neoplasm of unspecified part of left bronchus or lung: Secondary | ICD-10-CM | POA: Diagnosis not present

## 2018-07-09 DIAGNOSIS — R262 Difficulty in walking, not elsewhere classified: Secondary | ICD-10-CM | POA: Diagnosis not present

## 2018-07-09 DIAGNOSIS — C7931 Secondary malignant neoplasm of brain: Secondary | ICD-10-CM | POA: Diagnosis not present

## 2018-07-09 DIAGNOSIS — M5441 Lumbago with sciatica, right side: Secondary | ICD-10-CM | POA: Diagnosis not present

## 2018-07-09 DIAGNOSIS — C3492 Malignant neoplasm of unspecified part of left bronchus or lung: Secondary | ICD-10-CM | POA: Diagnosis not present

## 2018-07-09 DIAGNOSIS — I10 Essential (primary) hypertension: Secondary | ICD-10-CM | POA: Diagnosis not present

## 2018-07-09 DIAGNOSIS — M545 Low back pain: Secondary | ICD-10-CM | POA: Diagnosis not present

## 2018-07-10 DIAGNOSIS — I10 Essential (primary) hypertension: Secondary | ICD-10-CM | POA: Diagnosis not present

## 2018-07-10 DIAGNOSIS — C7931 Secondary malignant neoplasm of brain: Secondary | ICD-10-CM | POA: Diagnosis not present

## 2018-07-10 DIAGNOSIS — C3492 Malignant neoplasm of unspecified part of left bronchus or lung: Secondary | ICD-10-CM | POA: Diagnosis not present

## 2018-07-11 DIAGNOSIS — C3492 Malignant neoplasm of unspecified part of left bronchus or lung: Secondary | ICD-10-CM | POA: Diagnosis not present

## 2018-07-11 DIAGNOSIS — C7931 Secondary malignant neoplasm of brain: Secondary | ICD-10-CM | POA: Diagnosis not present

## 2018-07-11 DIAGNOSIS — I10 Essential (primary) hypertension: Secondary | ICD-10-CM | POA: Diagnosis not present

## 2018-07-12 DIAGNOSIS — C3492 Malignant neoplasm of unspecified part of left bronchus or lung: Secondary | ICD-10-CM | POA: Diagnosis not present

## 2018-07-12 DIAGNOSIS — I10 Essential (primary) hypertension: Secondary | ICD-10-CM | POA: Diagnosis not present

## 2018-07-12 DIAGNOSIS — C7931 Secondary malignant neoplasm of brain: Secondary | ICD-10-CM | POA: Diagnosis not present

## 2018-07-13 DIAGNOSIS — R262 Difficulty in walking, not elsewhere classified: Secondary | ICD-10-CM | POA: Diagnosis not present

## 2018-07-13 DIAGNOSIS — M5441 Lumbago with sciatica, right side: Secondary | ICD-10-CM | POA: Diagnosis not present

## 2018-07-13 DIAGNOSIS — I10 Essential (primary) hypertension: Secondary | ICD-10-CM | POA: Diagnosis not present

## 2018-07-13 DIAGNOSIS — C3492 Malignant neoplasm of unspecified part of left bronchus or lung: Secondary | ICD-10-CM | POA: Diagnosis not present

## 2018-07-13 DIAGNOSIS — C7931 Secondary malignant neoplasm of brain: Secondary | ICD-10-CM | POA: Diagnosis not present

## 2018-07-13 DIAGNOSIS — M545 Low back pain: Secondary | ICD-10-CM | POA: Diagnosis not present

## 2018-07-14 DIAGNOSIS — C3492 Malignant neoplasm of unspecified part of left bronchus or lung: Secondary | ICD-10-CM | POA: Diagnosis not present

## 2018-07-14 DIAGNOSIS — C7931 Secondary malignant neoplasm of brain: Secondary | ICD-10-CM | POA: Diagnosis not present

## 2018-07-14 DIAGNOSIS — I10 Essential (primary) hypertension: Secondary | ICD-10-CM | POA: Diagnosis not present

## 2018-07-15 DIAGNOSIS — C7931 Secondary malignant neoplasm of brain: Secondary | ICD-10-CM | POA: Diagnosis not present

## 2018-07-15 DIAGNOSIS — I10 Essential (primary) hypertension: Secondary | ICD-10-CM | POA: Diagnosis not present

## 2018-07-15 DIAGNOSIS — C3492 Malignant neoplasm of unspecified part of left bronchus or lung: Secondary | ICD-10-CM | POA: Diagnosis not present

## 2018-07-16 DIAGNOSIS — C7931 Secondary malignant neoplasm of brain: Secondary | ICD-10-CM | POA: Diagnosis not present

## 2018-07-16 DIAGNOSIS — M5441 Lumbago with sciatica, right side: Secondary | ICD-10-CM | POA: Diagnosis not present

## 2018-07-16 DIAGNOSIS — M545 Low back pain: Secondary | ICD-10-CM | POA: Diagnosis not present

## 2018-07-16 DIAGNOSIS — I10 Essential (primary) hypertension: Secondary | ICD-10-CM | POA: Diagnosis not present

## 2018-07-16 DIAGNOSIS — R262 Difficulty in walking, not elsewhere classified: Secondary | ICD-10-CM | POA: Diagnosis not present

## 2018-07-16 DIAGNOSIS — C3492 Malignant neoplasm of unspecified part of left bronchus or lung: Secondary | ICD-10-CM | POA: Diagnosis not present

## 2018-07-17 DIAGNOSIS — I10 Essential (primary) hypertension: Secondary | ICD-10-CM | POA: Diagnosis not present

## 2018-07-17 DIAGNOSIS — C3492 Malignant neoplasm of unspecified part of left bronchus or lung: Secondary | ICD-10-CM | POA: Diagnosis not present

## 2018-07-17 DIAGNOSIS — C7931 Secondary malignant neoplasm of brain: Secondary | ICD-10-CM | POA: Diagnosis not present

## 2018-07-18 DIAGNOSIS — C3492 Malignant neoplasm of unspecified part of left bronchus or lung: Secondary | ICD-10-CM | POA: Diagnosis not present

## 2018-07-18 DIAGNOSIS — C7931 Secondary malignant neoplasm of brain: Secondary | ICD-10-CM | POA: Diagnosis not present

## 2018-07-18 DIAGNOSIS — I10 Essential (primary) hypertension: Secondary | ICD-10-CM | POA: Diagnosis not present

## 2018-07-19 DIAGNOSIS — C3492 Malignant neoplasm of unspecified part of left bronchus or lung: Secondary | ICD-10-CM | POA: Diagnosis not present

## 2018-07-19 DIAGNOSIS — I10 Essential (primary) hypertension: Secondary | ICD-10-CM | POA: Diagnosis not present

## 2018-07-19 DIAGNOSIS — C7931 Secondary malignant neoplasm of brain: Secondary | ICD-10-CM | POA: Diagnosis not present

## 2018-07-20 DIAGNOSIS — I10 Essential (primary) hypertension: Secondary | ICD-10-CM | POA: Diagnosis not present

## 2018-07-20 DIAGNOSIS — R262 Difficulty in walking, not elsewhere classified: Secondary | ICD-10-CM | POA: Diagnosis not present

## 2018-07-20 DIAGNOSIS — M5441 Lumbago with sciatica, right side: Secondary | ICD-10-CM | POA: Diagnosis not present

## 2018-07-20 DIAGNOSIS — C3492 Malignant neoplasm of unspecified part of left bronchus or lung: Secondary | ICD-10-CM | POA: Diagnosis not present

## 2018-07-20 DIAGNOSIS — M545 Low back pain: Secondary | ICD-10-CM | POA: Diagnosis not present

## 2018-07-20 DIAGNOSIS — C7931 Secondary malignant neoplasm of brain: Secondary | ICD-10-CM | POA: Diagnosis not present

## 2018-07-21 DIAGNOSIS — C3492 Malignant neoplasm of unspecified part of left bronchus or lung: Secondary | ICD-10-CM | POA: Diagnosis not present

## 2018-07-21 DIAGNOSIS — I10 Essential (primary) hypertension: Secondary | ICD-10-CM | POA: Diagnosis not present

## 2018-07-21 DIAGNOSIS — C7931 Secondary malignant neoplasm of brain: Secondary | ICD-10-CM | POA: Diagnosis not present

## 2018-07-22 DIAGNOSIS — C7931 Secondary malignant neoplasm of brain: Secondary | ICD-10-CM | POA: Diagnosis not present

## 2018-07-22 DIAGNOSIS — I10 Essential (primary) hypertension: Secondary | ICD-10-CM | POA: Diagnosis not present

## 2018-07-22 DIAGNOSIS — C3492 Malignant neoplasm of unspecified part of left bronchus or lung: Secondary | ICD-10-CM | POA: Diagnosis not present

## 2018-07-23 DIAGNOSIS — I10 Essential (primary) hypertension: Secondary | ICD-10-CM | POA: Diagnosis not present

## 2018-07-23 DIAGNOSIS — C3492 Malignant neoplasm of unspecified part of left bronchus or lung: Secondary | ICD-10-CM | POA: Diagnosis not present

## 2018-07-23 DIAGNOSIS — C7931 Secondary malignant neoplasm of brain: Secondary | ICD-10-CM | POA: Diagnosis not present

## 2018-07-24 DIAGNOSIS — C7931 Secondary malignant neoplasm of brain: Secondary | ICD-10-CM | POA: Diagnosis not present

## 2018-07-24 DIAGNOSIS — R262 Difficulty in walking, not elsewhere classified: Secondary | ICD-10-CM | POA: Diagnosis not present

## 2018-07-24 DIAGNOSIS — C3492 Malignant neoplasm of unspecified part of left bronchus or lung: Secondary | ICD-10-CM | POA: Diagnosis not present

## 2018-07-24 DIAGNOSIS — I10 Essential (primary) hypertension: Secondary | ICD-10-CM | POA: Diagnosis not present

## 2018-07-24 DIAGNOSIS — M5441 Lumbago with sciatica, right side: Secondary | ICD-10-CM | POA: Diagnosis not present

## 2018-07-24 DIAGNOSIS — M545 Low back pain: Secondary | ICD-10-CM | POA: Diagnosis not present

## 2018-07-25 DIAGNOSIS — C7931 Secondary malignant neoplasm of brain: Secondary | ICD-10-CM | POA: Diagnosis not present

## 2018-07-25 DIAGNOSIS — I10 Essential (primary) hypertension: Secondary | ICD-10-CM | POA: Diagnosis not present

## 2018-07-25 DIAGNOSIS — C3492 Malignant neoplasm of unspecified part of left bronchus or lung: Secondary | ICD-10-CM | POA: Diagnosis not present

## 2018-07-26 DIAGNOSIS — C7931 Secondary malignant neoplasm of brain: Secondary | ICD-10-CM | POA: Diagnosis not present

## 2018-07-26 DIAGNOSIS — C3492 Malignant neoplasm of unspecified part of left bronchus or lung: Secondary | ICD-10-CM | POA: Diagnosis not present

## 2018-07-26 DIAGNOSIS — I10 Essential (primary) hypertension: Secondary | ICD-10-CM | POA: Diagnosis not present

## 2018-07-27 DIAGNOSIS — C3492 Malignant neoplasm of unspecified part of left bronchus or lung: Secondary | ICD-10-CM | POA: Diagnosis not present

## 2018-07-27 DIAGNOSIS — I10 Essential (primary) hypertension: Secondary | ICD-10-CM | POA: Diagnosis not present

## 2018-07-27 DIAGNOSIS — R262 Difficulty in walking, not elsewhere classified: Secondary | ICD-10-CM | POA: Diagnosis not present

## 2018-07-27 DIAGNOSIS — M545 Low back pain: Secondary | ICD-10-CM | POA: Diagnosis not present

## 2018-07-27 DIAGNOSIS — C7931 Secondary malignant neoplasm of brain: Secondary | ICD-10-CM | POA: Diagnosis not present

## 2018-07-27 DIAGNOSIS — M5441 Lumbago with sciatica, right side: Secondary | ICD-10-CM | POA: Diagnosis not present

## 2018-07-28 DIAGNOSIS — I10 Essential (primary) hypertension: Secondary | ICD-10-CM | POA: Diagnosis not present

## 2018-07-28 DIAGNOSIS — C3492 Malignant neoplasm of unspecified part of left bronchus or lung: Secondary | ICD-10-CM | POA: Diagnosis not present

## 2018-07-28 DIAGNOSIS — C7931 Secondary malignant neoplasm of brain: Secondary | ICD-10-CM | POA: Diagnosis not present

## 2018-07-29 DIAGNOSIS — I10 Essential (primary) hypertension: Secondary | ICD-10-CM | POA: Diagnosis not present

## 2018-07-29 DIAGNOSIS — G893 Neoplasm related pain (acute) (chronic): Secondary | ICD-10-CM | POA: Diagnosis not present

## 2018-07-29 DIAGNOSIS — C3492 Malignant neoplasm of unspecified part of left bronchus or lung: Secondary | ICD-10-CM | POA: Diagnosis not present

## 2018-07-29 DIAGNOSIS — C7931 Secondary malignant neoplasm of brain: Secondary | ICD-10-CM | POA: Diagnosis not present

## 2018-07-30 DIAGNOSIS — C7931 Secondary malignant neoplasm of brain: Secondary | ICD-10-CM | POA: Diagnosis not present

## 2018-07-30 DIAGNOSIS — C3492 Malignant neoplasm of unspecified part of left bronchus or lung: Secondary | ICD-10-CM | POA: Diagnosis not present

## 2018-07-30 DIAGNOSIS — I10 Essential (primary) hypertension: Secondary | ICD-10-CM | POA: Diagnosis not present

## 2018-07-30 DIAGNOSIS — R262 Difficulty in walking, not elsewhere classified: Secondary | ICD-10-CM | POA: Diagnosis not present

## 2018-07-30 DIAGNOSIS — M545 Low back pain: Secondary | ICD-10-CM | POA: Diagnosis not present

## 2018-07-30 DIAGNOSIS — M5441 Lumbago with sciatica, right side: Secondary | ICD-10-CM | POA: Diagnosis not present

## 2018-07-31 DIAGNOSIS — I10 Essential (primary) hypertension: Secondary | ICD-10-CM | POA: Diagnosis not present

## 2018-07-31 DIAGNOSIS — C3492 Malignant neoplasm of unspecified part of left bronchus or lung: Secondary | ICD-10-CM | POA: Diagnosis not present

## 2018-07-31 DIAGNOSIS — C7931 Secondary malignant neoplasm of brain: Secondary | ICD-10-CM | POA: Diagnosis not present

## 2018-08-01 DIAGNOSIS — I10 Essential (primary) hypertension: Secondary | ICD-10-CM | POA: Diagnosis not present

## 2018-08-01 DIAGNOSIS — C7931 Secondary malignant neoplasm of brain: Secondary | ICD-10-CM | POA: Diagnosis not present

## 2018-08-01 DIAGNOSIS — C3492 Malignant neoplasm of unspecified part of left bronchus or lung: Secondary | ICD-10-CM | POA: Diagnosis not present

## 2018-08-02 DIAGNOSIS — C3492 Malignant neoplasm of unspecified part of left bronchus or lung: Secondary | ICD-10-CM | POA: Diagnosis not present

## 2018-08-02 DIAGNOSIS — C7931 Secondary malignant neoplasm of brain: Secondary | ICD-10-CM | POA: Diagnosis not present

## 2018-08-02 DIAGNOSIS — I10 Essential (primary) hypertension: Secondary | ICD-10-CM | POA: Diagnosis not present

## 2018-08-03 DIAGNOSIS — I10 Essential (primary) hypertension: Secondary | ICD-10-CM | POA: Diagnosis not present

## 2018-08-03 DIAGNOSIS — R262 Difficulty in walking, not elsewhere classified: Secondary | ICD-10-CM | POA: Diagnosis not present

## 2018-08-03 DIAGNOSIS — M545 Low back pain: Secondary | ICD-10-CM | POA: Diagnosis not present

## 2018-08-03 DIAGNOSIS — C3492 Malignant neoplasm of unspecified part of left bronchus or lung: Secondary | ICD-10-CM | POA: Diagnosis not present

## 2018-08-03 DIAGNOSIS — C7931 Secondary malignant neoplasm of brain: Secondary | ICD-10-CM | POA: Diagnosis not present

## 2018-08-03 DIAGNOSIS — M5441 Lumbago with sciatica, right side: Secondary | ICD-10-CM | POA: Diagnosis not present

## 2018-08-06 DIAGNOSIS — R262 Difficulty in walking, not elsewhere classified: Secondary | ICD-10-CM | POA: Diagnosis not present

## 2018-08-06 DIAGNOSIS — M545 Low back pain: Secondary | ICD-10-CM | POA: Diagnosis not present

## 2018-08-06 DIAGNOSIS — M5441 Lumbago with sciatica, right side: Secondary | ICD-10-CM | POA: Diagnosis not present

## 2018-08-10 DIAGNOSIS — M545 Low back pain: Secondary | ICD-10-CM | POA: Diagnosis not present

## 2018-08-10 DIAGNOSIS — R262 Difficulty in walking, not elsewhere classified: Secondary | ICD-10-CM | POA: Diagnosis not present

## 2018-08-10 DIAGNOSIS — M5441 Lumbago with sciatica, right side: Secondary | ICD-10-CM | POA: Diagnosis not present

## 2018-08-13 DIAGNOSIS — M1990 Unspecified osteoarthritis, unspecified site: Secondary | ICD-10-CM | POA: Diagnosis not present

## 2018-08-13 DIAGNOSIS — C7931 Secondary malignant neoplasm of brain: Secondary | ICD-10-CM | POA: Diagnosis not present

## 2018-08-13 DIAGNOSIS — M546 Pain in thoracic spine: Secondary | ICD-10-CM | POA: Diagnosis not present

## 2018-08-13 DIAGNOSIS — C3492 Malignant neoplasm of unspecified part of left bronchus or lung: Secondary | ICD-10-CM | POA: Diagnosis not present

## 2018-08-13 DIAGNOSIS — C3490 Malignant neoplasm of unspecified part of unspecified bronchus or lung: Secondary | ICD-10-CM | POA: Diagnosis not present

## 2018-08-13 DIAGNOSIS — R918 Other nonspecific abnormal finding of lung field: Secondary | ICD-10-CM | POA: Diagnosis not present

## 2018-08-13 DIAGNOSIS — Z85118 Personal history of other malignant neoplasm of bronchus and lung: Secondary | ICD-10-CM | POA: Diagnosis not present

## 2018-08-14 DIAGNOSIS — M545 Low back pain: Secondary | ICD-10-CM | POA: Diagnosis not present

## 2018-08-14 DIAGNOSIS — R262 Difficulty in walking, not elsewhere classified: Secondary | ICD-10-CM | POA: Diagnosis not present

## 2018-08-14 DIAGNOSIS — M5441 Lumbago with sciatica, right side: Secondary | ICD-10-CM | POA: Diagnosis not present

## 2018-08-17 DIAGNOSIS — M545 Low back pain: Secondary | ICD-10-CM | POA: Diagnosis not present

## 2018-08-17 DIAGNOSIS — M5441 Lumbago with sciatica, right side: Secondary | ICD-10-CM | POA: Diagnosis not present

## 2018-08-17 DIAGNOSIS — R262 Difficulty in walking, not elsewhere classified: Secondary | ICD-10-CM | POA: Diagnosis not present

## 2018-08-24 DIAGNOSIS — J90 Pleural effusion, not elsewhere classified: Secondary | ICD-10-CM | POA: Diagnosis not present

## 2018-08-24 DIAGNOSIS — R109 Unspecified abdominal pain: Secondary | ICD-10-CM | POA: Diagnosis not present

## 2018-08-24 DIAGNOSIS — R918 Other nonspecific abnormal finding of lung field: Secondary | ICD-10-CM | POA: Diagnosis not present

## 2018-08-24 DIAGNOSIS — R112 Nausea with vomiting, unspecified: Secondary | ICD-10-CM | POA: Diagnosis not present

## 2018-08-24 DIAGNOSIS — R509 Fever, unspecified: Secondary | ICD-10-CM | POA: Diagnosis not present

## 2018-08-24 DIAGNOSIS — R197 Diarrhea, unspecified: Secondary | ICD-10-CM | POA: Diagnosis not present

## 2018-08-24 DIAGNOSIS — J9601 Acute respiratory failure with hypoxia: Secondary | ICD-10-CM | POA: Diagnosis not present

## 2018-08-24 DIAGNOSIS — A419 Sepsis, unspecified organism: Secondary | ICD-10-CM | POA: Diagnosis not present

## 2018-08-24 DIAGNOSIS — J181 Lobar pneumonia, unspecified organism: Secondary | ICD-10-CM | POA: Diagnosis not present

## 2018-08-25 DIAGNOSIS — J189 Pneumonia, unspecified organism: Secondary | ICD-10-CM | POA: Diagnosis not present

## 2018-08-25 DIAGNOSIS — R509 Fever, unspecified: Secondary | ICD-10-CM | POA: Diagnosis not present

## 2018-08-25 DIAGNOSIS — K529 Noninfective gastroenteritis and colitis, unspecified: Secondary | ICD-10-CM | POA: Diagnosis not present

## 2018-08-25 DIAGNOSIS — R911 Solitary pulmonary nodule: Secondary | ICD-10-CM | POA: Diagnosis not present

## 2018-08-25 DIAGNOSIS — C3490 Malignant neoplasm of unspecified part of unspecified bronchus or lung: Secondary | ICD-10-CM | POA: Diagnosis not present

## 2018-08-25 DIAGNOSIS — R1114 Bilious vomiting: Secondary | ICD-10-CM | POA: Diagnosis not present

## 2018-08-25 DIAGNOSIS — J90 Pleural effusion, not elsewhere classified: Secondary | ICD-10-CM | POA: Diagnosis not present

## 2018-08-25 DIAGNOSIS — C3492 Malignant neoplasm of unspecified part of left bronchus or lung: Secondary | ICD-10-CM | POA: Diagnosis not present

## 2018-08-25 DIAGNOSIS — K5730 Diverticulosis of large intestine without perforation or abscess without bleeding: Secondary | ICD-10-CM | POA: Diagnosis not present

## 2018-08-26 DIAGNOSIS — R1114 Bilious vomiting: Secondary | ICD-10-CM | POA: Diagnosis not present

## 2018-08-26 DIAGNOSIS — J189 Pneumonia, unspecified organism: Secondary | ICD-10-CM | POA: Diagnosis not present

## 2018-08-26 DIAGNOSIS — R509 Fever, unspecified: Secondary | ICD-10-CM | POA: Diagnosis not present

## 2018-08-27 DIAGNOSIS — J189 Pneumonia, unspecified organism: Secondary | ICD-10-CM | POA: Diagnosis not present

## 2018-08-27 DIAGNOSIS — R509 Fever, unspecified: Secondary | ICD-10-CM | POA: Diagnosis not present

## 2018-08-27 DIAGNOSIS — R1114 Bilious vomiting: Secondary | ICD-10-CM | POA: Diagnosis not present

## 2018-08-28 DIAGNOSIS — J189 Pneumonia, unspecified organism: Secondary | ICD-10-CM | POA: Diagnosis not present

## 2018-08-28 DIAGNOSIS — C3490 Malignant neoplasm of unspecified part of unspecified bronchus or lung: Secondary | ICD-10-CM | POA: Diagnosis not present

## 2018-08-28 DIAGNOSIS — R1114 Bilious vomiting: Secondary | ICD-10-CM | POA: Diagnosis not present

## 2018-08-28 DIAGNOSIS — R509 Fever, unspecified: Secondary | ICD-10-CM | POA: Diagnosis not present

## 2018-09-04 DIAGNOSIS — J9601 Acute respiratory failure with hypoxia: Secondary | ICD-10-CM | POA: Diagnosis not present

## 2018-09-04 DIAGNOSIS — J189 Pneumonia, unspecified organism: Secondary | ICD-10-CM | POA: Diagnosis not present

## 2018-09-04 DIAGNOSIS — Z08 Encounter for follow-up examination after completed treatment for malignant neoplasm: Secondary | ICD-10-CM | POA: Diagnosis not present

## 2018-09-15 DIAGNOSIS — M810 Age-related osteoporosis without current pathological fracture: Secondary | ICD-10-CM | POA: Diagnosis not present

## 2018-09-24 DIAGNOSIS — M810 Age-related osteoporosis without current pathological fracture: Secondary | ICD-10-CM | POA: Diagnosis not present

## 2018-09-24 DIAGNOSIS — E119 Type 2 diabetes mellitus without complications: Secondary | ICD-10-CM | POA: Diagnosis not present

## 2018-09-28 DIAGNOSIS — C3490 Malignant neoplasm of unspecified part of unspecified bronchus or lung: Secondary | ICD-10-CM | POA: Diagnosis not present

## 2018-10-02 DIAGNOSIS — C3492 Malignant neoplasm of unspecified part of left bronchus or lung: Secondary | ICD-10-CM | POA: Diagnosis not present

## 2018-10-02 DIAGNOSIS — C7951 Secondary malignant neoplasm of bone: Secondary | ICD-10-CM | POA: Diagnosis not present

## 2018-10-02 DIAGNOSIS — M8458XA Pathological fracture in neoplastic disease, other specified site, initial encounter for fracture: Secondary | ICD-10-CM | POA: Diagnosis not present

## 2018-11-13 DIAGNOSIS — G4700 Insomnia, unspecified: Secondary | ICD-10-CM | POA: Diagnosis not present

## 2018-11-13 DIAGNOSIS — C3432 Malignant neoplasm of lower lobe, left bronchus or lung: Secondary | ICD-10-CM | POA: Diagnosis not present

## 2018-11-13 DIAGNOSIS — G620 Drug-induced polyneuropathy: Secondary | ICD-10-CM | POA: Diagnosis not present

## 2018-11-13 DIAGNOSIS — C7931 Secondary malignant neoplasm of brain: Secondary | ICD-10-CM | POA: Diagnosis not present

## 2018-11-13 DIAGNOSIS — C3492 Malignant neoplasm of unspecified part of left bronchus or lung: Secondary | ICD-10-CM | POA: Diagnosis not present

## 2018-11-13 DIAGNOSIS — Z5111 Encounter for antineoplastic chemotherapy: Secondary | ICD-10-CM | POA: Diagnosis not present

## 2018-11-13 DIAGNOSIS — Z08 Encounter for follow-up examination after completed treatment for malignant neoplasm: Secondary | ICD-10-CM | POA: Diagnosis not present

## 2018-11-26 DIAGNOSIS — M818 Other osteoporosis without current pathological fracture: Secondary | ICD-10-CM | POA: Insufficient documentation

## 2018-12-07 ENCOUNTER — Encounter: Payer: Self-pay | Admitting: Emergency Medicine

## 2018-12-07 ENCOUNTER — Ambulatory Visit
Admission: EM | Admit: 2018-12-07 | Discharge: 2018-12-07 | Disposition: A | Discharge status: Nursing Home (Includes ICF) | Payer: Commercial Managed Care - PPO | Attending: Family Medicine | Admitting: Family Medicine

## 2018-12-07 ENCOUNTER — Other Ambulatory Visit: Payer: Self-pay

## 2018-12-07 DIAGNOSIS — R0602 Shortness of breath: Secondary | ICD-10-CM | POA: Diagnosis not present

## 2018-12-07 DIAGNOSIS — J9601 Acute respiratory failure with hypoxia: Secondary | ICD-10-CM | POA: Diagnosis not present

## 2018-12-07 DIAGNOSIS — C7800 Secondary malignant neoplasm of unspecified lung: Secondary | ICD-10-CM

## 2018-12-07 DIAGNOSIS — J441 Chronic obstructive pulmonary disease with (acute) exacerbation: Secondary | ICD-10-CM | POA: Diagnosis not present

## 2018-12-07 DIAGNOSIS — C3432 Malignant neoplasm of lower lobe, left bronchus or lung: Secondary | ICD-10-CM | POA: Diagnosis not present

## 2018-12-07 DIAGNOSIS — J90 Pleural effusion, not elsewhere classified: Secondary | ICD-10-CM | POA: Diagnosis not present

## 2018-12-07 DIAGNOSIS — C3492 Malignant neoplasm of unspecified part of left bronchus or lung: Secondary | ICD-10-CM | POA: Diagnosis not present

## 2018-12-07 DIAGNOSIS — J189 Pneumonia, unspecified organism: Secondary | ICD-10-CM | POA: Diagnosis not present

## 2018-12-07 DIAGNOSIS — C3490 Malignant neoplasm of unspecified part of unspecified bronchus or lung: Secondary | ICD-10-CM | POA: Diagnosis not present

## 2018-12-07 HISTORY — DX: Malignant (primary) neoplasm, unspecified: C80.1

## 2018-12-07 NOTE — ED Provider Notes (Signed)
[facility]    CSN: [id number] Arrival date & time: 12/07/18  1633     History   Chief Complaint Chief Complaint  Patient presents with  . Shortness of Breath   HPI   58 year old female with metastatic non-squamous cell lung cancer with metastasis presents with shortness of breath.  Patient reports acute onset shortness of breath which started at 11 AM this morning.  Patient is currently visibly short of breath with accessory muscle use.  Cannot speak in full sentences.  No fever.  No cough.  Patient not currently on cancer treatment.  She is been off since January 2019.  Patient hypoxic upon arrival.  Required oxygen here.  Currently satting 95% on 2 L of oxygen.  Has some leftover oxygen at home.  Patient asking about inhaler therapy.  Patient is adamant that she does not want to go to the hospital.  PMH, Surgical Hx, Family Hx, Social History reviewed and updated as below.  PMH: Tobacco use    Cancer (CMS-HCC)    Arthritis    Lung cancer (CMS-HCC) 2018   Autoimmune hepatitis (CMS-HCC)    Compression fx, thoracic spine, closed, initial encounter (CMS-HCC) 05/13/2018   Encounter for blood transfusion     Surgical Hx: ENDOMETRIAL ABLATION W/ NOVASURE      LAPAROSCOPIC CHOLECYSTECTOMY      BRONCHOSCOPY 03/17/2017 Left Procedure: BRONCHOSCOPY, RIGID OR FLEXIBLE, INCL FLUOROSCOPIC GUIDANCE; WITH TRANSENDOSCOPIC ENDOBRONCHIAL ULTRASOUND (EBUS) DURING BRONCHOSCOPIC DIAGNOSTIC/THERAPEUTIC INTERVENTION(S) FOR PERIPHERAL LESION(S); Surgeon: Adrian Blackwater, MD; [facility]; Service: Pulmonary; Laterality: Left;   BRONCHOSCOPY W/TRANSBRONCHIAL BIOPSY FLEXIBLE 03/17/2017  Procedure: BRONCHOSCOPY, FLEXIBLE, INCLUDING FLUOROSCOPIC GUIDANCE, WHEN PERFORMED; WITH TRANSBRONCHIAL LUNG BIOPSY(S), SINGLE LOBE; Surgeon: Adrian Blackwater, MD; Location: [facility]; Service: Pulmonary;;   BRONCHOSCOPY FLEXIBLE 03/17/2017 Left  Procedure: BRONCHOSCOPY,  RIGID OR FLEXIBLE, INCLUDING FLUOROSCOPIC GUIDANCE, WHEN PERFORMED; WITH BRONCHIAL ALVEOLAR LAVAGE; Surgeon: Adrian Blackwater, MD; Location: [facility]; Service: Pulmonary;;   CHOLECYSTECTOMY      TONSILLECTOMY       OB History   No obstetric history on file.     Home Medications    Prior to Admission medications   Medication Sig Start Date End Date Taking? Authorizing Provider  cloNIDine (CATAPRES) 0.1 MG tablet Take by mouth. 11/09/18 11/09/19 Yes [provider]  dexamethasone (DECADRON) 0.5 MG tablet Take by mouth. 11/16/18 02/14/19 Yes [provider]  HYDROcodone-acetaminophen (NORCO/VICODIN) 5-325 MG tablet Take by mouth. 05/17/15  Yes [provider]  HYDROmorphone (DILAUDID) 4 MG tablet Take by mouth. 11/27/18 12/27/18 Yes [provider]  LORazepam (ATIVAN) 0.5 MG tablet Take by mouth. 11/27/18 12/27/18 Yes [provider]  OLANZapine (ZYPREXA) 5 MG tablet Take by mouth. 10/02/18 12/31/18 Yes [provider]  Vitamin D, Ergocalciferol, (DRISDOL) 1.25 MG (50000 UT) CAPS capsule Take 1 capsule by mouth weekly for 12 weeks 10/12/18  Yes [provider]  levofloxacin (LEVAQUIN) 500 MG tablet Take 1 tablet (500 mg total) by mouth daily. 02/23/17   Duanne Guess, PA-C  pseudoephedrine (SUDAFED) 60 MG tablet Take 1 tablet (60 mg total) by mouth every 6 (six) hours as needed for congestion. 02/23/17   Duanne Guess, PA-C   Family History Family History  Problem Relation Age of Onset  . Breast cancer Neg Hx    Social History Social History   Tobacco Use  . Smoking status: Never Smoker  . Smokeless tobacco: Never Used  Substance Use Topics  . Alcohol use: No  . Drug use:  No    Allergies   Patient has no known allergies.  Review of Systems Review of Systems  Constitutional: Negative for fever.  Respiratory: Positive for shortness of breath.    Physical Exam Triage Vital Signs ED Triage Vitals  Enc Vitals Group      BP 12/07/18 1641 (!) 157/118     Pulse Rate 12/07/18 1641 95     Resp 12/07/18 1641 (!) 22     Temp 12/07/18 1641 98.4 F (36.9 C)     Temp Source 12/07/18 1641 Oral     SpO2 12/07/18 1641 (!) 85 %     Weight 12/07/18 1637 150 lb (68 kg)     Height 12/07/18 1637 5\' 3"  (1.6 m)     Head Circumference --      Peak Flow --      Pain Score 12/07/18 1637 9     Pain Loc --      Pain Edu? --      Excl. in [place]? --    Updated Vital Signs BP (!) 157/118 (BP Location: Left Arm)   Pulse 95   Temp 98.4 F (36.9 C) (Oral)   Resp 20   Ht 5\' 3"  (1.6 m)   Wt 68 kg   SpO2 95%   BMI 26.57 kg/m   Visual Acuity Right Eye Distance:   Left Eye Distance:   Bilateral Distance:    Right Eye Near:   Left Eye Near:    Bilateral Near:     Physical Exam Vitals signs and nursing note reviewed.  Constitutional:      Appearance: She is not diaphoretic.     Comments: Dyspneic.  Accessory muscle use noted.  HENT:     Head: Normocephalic and atraumatic.  Eyes:     General:        Right eye: No discharge.        Left eye: No discharge.     Conjunctiva/sclera: Conjunctivae normal.  Cardiovascular:     Rate and Rhythm: Regular rhythm. Tachycardia present.     Comments: Trace to 1+ lower extremity edema. Pulmonary:     Effort: Respiratory distress present.     Breath sounds: No wheezing or rales.  Skin:    General: Skin is warm.  Neurological:     Mental Status: She is alert.  Psychiatric:        Mood and Affect: Mood normal.        Behavior: Behavior normal.    UC Treatments / Results  Labs (all labs ordered are listed, but only abnormal results are displayed) Labs Reviewed - No data to display  EKG None  Radiology No results found.  Procedures Procedures (including critical care time)  Medications Ordered in UC Medications - No data to display  Initial Impression / Assessment and Plan / UC Course  I have reviewed the triage vital signs and the nursing notes.  Pertinent  labs & imaging results that were available during my care of the patient were reviewed by me and considered in my medical decision making (see chart for details).    58 year old female with lung cancer with metastasis presents with acute shortness of breath.  Patient hypoxic on arrival with increased work of breathing including accessory muscle use.  I have advised her that she needs to go to the ER.  Recommend EMS transported patient refused [place].  I have spoken with her husband.  She has some leftover oxygen at home.  Her  son will be bringing it here and her husband will transport her directly to the ER at [facility].  Final Clinical Impressions(s) / UC Diagnoses   Final diagnoses:  SOB (shortness of breath)     Discharge Instructions     Go straight to the ER.  Best of luck.  Dr. Lacinda Axon     ED Prescriptions    None     Controlled Substance Prescriptions [place] Controlled Substance Registry consulted? Not Applicable   Coral Spikes, DO 12/07/18 1731

## 2018-12-07 NOTE — Discharge Instructions (Signed)
Go straight to the ER.  Best of luck.  Dr. Lacinda Axon

## 2018-12-07 NOTE — ED Triage Notes (Signed)
Patient c/o SOB that started around 11am this morning.  Patient reports history of lung cancer.

## 2018-12-08 DIAGNOSIS — J449 Chronic obstructive pulmonary disease, unspecified: Secondary | ICD-10-CM | POA: Insufficient documentation

## 2018-12-09 MED ORDER — OLANZAPINE 5 MG PO TABS
5.00 | ORAL_TABLET | ORAL | Status: DC
Start: 2018-12-08 — End: 2018-12-09

## 2018-12-09 MED ORDER — MOXIFLOXACIN HCL 400 MG PO TABS
400.00 | ORAL_TABLET | ORAL | Status: DC
Start: 2018-12-09 — End: 2018-12-09

## 2018-12-09 MED ORDER — POTASSIUM CHLORIDE 20 MEQ PO PACK
20.00 | PACK | ORAL | Status: DC
Start: 2018-12-09 — End: 2018-12-09

## 2018-12-09 MED ORDER — IRBESARTAN
0.50 | Status: DC
Start: ? — End: 2018-12-09

## 2018-12-09 MED ORDER — ALBUTEROL SULFATE HFA 108 (90 BASE) MCG/ACT IN AERS
2.00 | INHALATION_SPRAY | RESPIRATORY_TRACT | Status: DC
Start: ? — End: 2018-12-09

## 2018-12-09 MED ORDER — FOLIC ACID 1 MG PO TABS
1.00 | ORAL_TABLET | ORAL | Status: DC
Start: 2018-12-09 — End: 2018-12-09

## 2018-12-09 MED ORDER — HYDROMORPHONE HCL 4 MG PO TABS
4.00 | ORAL_TABLET | ORAL | Status: DC
Start: ? — End: 2018-12-09

## 2018-12-09 MED ORDER — GENERIC EXTERNAL MEDICATION
.30 | Status: DC
Start: 2018-12-08 — End: 2018-12-09

## 2018-12-09 MED ORDER — PREDNISONE 20 MG PO TABS
40.00 | ORAL_TABLET | ORAL | Status: DC
Start: 2018-12-09 — End: 2018-12-09

## 2018-12-09 MED ORDER — IPRATROPIUM-ALBUTEROL 0.5-2.5 (3) MG/3ML IN SOLN
3.00 | RESPIRATORY_TRACT | Status: DC
Start: ? — End: 2018-12-09

## 2018-12-09 MED ORDER — ENOXAPARIN SODIUM 40 MG/0.4ML ~~LOC~~ SOLN
40.00 | SUBCUTANEOUS | Status: DC
Start: 2018-12-08 — End: 2018-12-09

## 2018-12-09 MED ORDER — EQUATE NICOTINE 4 MG MT GUM
4.00 | CHEWING_GUM | OROMUCOSAL | Status: DC
Start: ? — End: 2018-12-09

## 2019-01-07 DIAGNOSIS — J441 Chronic obstructive pulmonary disease with (acute) exacerbation: Secondary | ICD-10-CM | POA: Diagnosis not present

## 2019-01-07 DIAGNOSIS — R0602 Shortness of breath: Secondary | ICD-10-CM | POA: Diagnosis not present

## 2019-01-07 DIAGNOSIS — C3490 Malignant neoplasm of unspecified part of unspecified bronchus or lung: Secondary | ICD-10-CM | POA: Diagnosis not present

## 2019-06-15 DIAGNOSIS — M1990 Unspecified osteoarthritis, unspecified site: Secondary | ICD-10-CM | POA: Insufficient documentation

## 2019-06-23 ENCOUNTER — Other Ambulatory Visit: Payer: Self-pay

## 2019-06-23 ENCOUNTER — Ambulatory Visit
Admission: EM | Admit: 2019-06-23 | Discharge: 2019-06-23 | Disposition: A | Discharge status: Home / Self Care | Payer: Commercial Managed Care - PPO | Attending: Family Medicine | Admitting: Family Medicine

## 2019-06-23 ENCOUNTER — Ambulatory Visit (INDEPENDENT_AMBULATORY_CARE_PROVIDER_SITE_OTHER): Payer: Commercial Managed Care - PPO

## 2019-06-23 ENCOUNTER — Encounter: Payer: Self-pay | Admitting: Emergency Medicine

## 2019-06-23 DIAGNOSIS — R0602 Shortness of breath: Secondary | ICD-10-CM

## 2019-06-23 DIAGNOSIS — Y999 Unspecified external cause status: Secondary | ICD-10-CM

## 2019-06-23 DIAGNOSIS — R0789 Other chest pain: Secondary | ICD-10-CM

## 2019-06-23 DIAGNOSIS — S2242XA Multiple fractures of ribs, left side, initial encounter for closed fracture: Secondary | ICD-10-CM | POA: Diagnosis not present

## 2019-06-23 NOTE — ED Triage Notes (Signed)
Patient c/o left side pain that started yesterday. Patient states she has lung cancer on the left side. Reports she was nauseated x 2 days. She is concerned she has pneumonia. Denies cough. Denies fever.

## 2019-06-23 NOTE — Discharge Instructions (Signed)
Call Oncology. You need to be seen soon (before originally scheduled visit).  Dilaudid as needed.  Take care  Dr. Lacinda Axon

## 2019-06-23 NOTE — ED Provider Notes (Signed)
[facility]    CSN: [id number] Arrival date & time: 06/23/19  1748  History   Chief Complaint Chief Complaint  Patient presents with  . Chest Pain    left side pain   HPI  58 year old female history of lung cancer with metastasis presents with left-sided lateral chest pain/rib pain.  Started yesterday.  Pain is located at the area of her bra strap on the left lateral chest/ribs.  Denies cough, fever.  Has some baseline shortness of breath.  Patient has a history of pneumonia.  She is concerned that she has pneumonia.  She rates her pain as 8/10 in severity.  She states that it is worse with pressure.  She has difficulty sleeping on her left side.  No relieving factors.  No other reported symptoms.  No other complaints.  PMH, Surgical Hx, Family Hx, Social History reviewed and updated as below.  PMH: Tobacco use    Arthritis    Lung cancer (CMS-HCC) 2018   Autoimmune hepatitis (CMS-HCC)    Compression fx, thoracic spine, closed, initial encounter (CMS-HCC) 05/13/2018   Encounter for blood transfusion    Pneumonia 12/08/2018     Surgical Hx: ENDOMETRIAL ABLATION W/ NOVASURE      LAPAROSCOPIC CHOLECYSTECTOMY      BRONCHOSCOPY 03/17/2017 Left Procedure: BRONCHOSCOPY, RIGID OR FLEXIBLE, INCL FLUOROSCOPIC GUIDANCE; WITH TRANSENDOSCOPIC ENDOBRONCHIAL ULTRASOUND (EBUS) DURING BRONCHOSCOPIC DIAGNOSTIC/THERAPEUTIC INTERVENTION(S) FOR PERIPHERAL LESION(S); Surgeon: Adrian Blackwater, MD; Location: [facility]; Service: Pulmonary; Laterality: Left;   BRONCHOSCOPY W/TRANSBRONCHIAL BIOPSY FLEXIBLE 03/17/2017  Procedure: BRONCHOSCOPY, FLEXIBLE, INCLUDING FLUOROSCOPIC GUIDANCE, WHEN PERFORMED; WITH TRANSBRONCHIAL LUNG BIOPSY(S), SINGLE LOBE; Surgeon: Adrian Blackwater, MD; Location: [facility]; Service: Pulmonary;;   BRONCHOSCOPY FLEXIBLE 03/17/2017 Left  Procedure: BRONCHOSCOPY, RIGID OR FLEXIBLE, INCLUDING FLUOROSCOPIC GUIDANCE, WHEN PERFORMED; WITH BRONCHIAL  ALVEOLAR LAVAGE; Surgeon: Adrian Blackwater, MD; Location: [facility]; Service: Pulmonary;;     OB History   No obstetric history on file.    Home Medications    Prior to Admission medications   Medication Sig Start Date End Date Taking? Authorizing Provider  alendronate (FOSAMAX) 70 MG tablet Take by mouth. 10/27/18 10/27/19 Yes [provider]  cloNIDine (CATAPRES) 0.1 MG tablet Take by mouth. 11/09/18 11/09/19 Yes [provider]  dexamethasone (DECADRON) 0.5 MG tablet TAKE 1 TABLET (0.5 MG TOTAL) BY MOUTH DAILY WITH BREAKFAST FOR 90 DAYS RESTART ON 4/19 05/22/19  Yes [provider]  folic acid (FOLVITE) 1 MG tablet TAKE 1 TABLET BY MOUTH EVERY DAY 05/20/19  Yes [provider]  HYDROcodone-acetaminophen (NORCO/VICODIN) 5-325 MG tablet Take by mouth. 05/17/15  Yes [provider]  HYDROmorphone (DILAUDID) 4 MG tablet Take by mouth. 06/03/19 07/03/19 Yes [provider]  LORazepam (ATIVAN) 0.5 MG tablet Take by mouth. 05/30/19  Yes [provider]  methocarbamol (ROBAXIN) 500 MG tablet Take by mouth. 04/09/19  Yes [provider]  simvastatin (ZOCOR) 10 MG tablet Take by mouth. 04/26/19 04/25/20 Yes [provider]  Vitamin D, Ergocalciferol, (DRISDOL) 1.25 MG (50000 UT) CAPS capsule Take 1 capsule by mouth weekly for 12 weeks 10/12/18  Yes [provider]  levofloxacin (LEVAQUIN) 500 MG tablet Take 1 tablet (500 mg total) by mouth daily. 02/23/17   Duanne Guess, PA-C  OLANZapine (ZYPREXA) 5 MG tablet Take by mouth. 10/02/18 12/31/18  [provider]  pseudoephedrine (SUDAFED) 60 MG tablet Take 1 tablet (60 mg total) by mouth every 6 (six) hours as needed for congestion. 02/23/17   Duanne Guess, PA-C  Family History Family History  Problem Relation Age of Onset  . Breast cancer Neg Hx     Social History Social History   Tobacco Use  . Smoking status: Never Smoker  . Smokeless tobacco: Never  Used  Substance Use Topics  . Alcohol use: No  . Drug use: No     Allergies   Patient has no known allergies.   Review of Systems Review of Systems  Respiratory: Positive for shortness of breath. Negative for cough.   Musculoskeletal:       Rib/chest wall pain.   Physical Exam Triage Vital Signs ED Triage Vitals  Enc Vitals Group     BP 06/23/19 1808 (!) 143/89     Pulse Rate 06/23/19 1808 70     Resp 06/23/19 1808 18     Temp 06/23/19 1808 98.1 F (36.7 C)     Temp Source 06/23/19 1808 Oral     SpO2 06/23/19 1808 98 %     Weight 06/23/19 1805 150 lb (68 kg)     Height 06/23/19 1805 5\' 5"  (1.651 m)     Head Circumference --      Peak Flow --      Pain Score 06/23/19 1805 8     Pain Loc --      Pain Edu? --      Excl. in [place]? --    Updated Vital Signs BP (!) 143/89 (BP Location: Right Arm)   Pulse 70   Temp 98.1 F (36.7 C) (Oral)   Resp 18   Ht 5\' 5"  (1.651 m)   Wt 68 kg   SpO2 98%   BMI 24.96 kg/m   Visual Acuity Right Eye Distance:   Left Eye Distance:   Bilateral Distance:    Right Eye Near:   Left Eye Near:    Bilateral Near:     Physical Exam Vitals signs and nursing note reviewed.  Constitutional:      General: She is not in acute distress.    Appearance: Normal appearance. She is not ill-appearing.  HENT:     Head: Normocephalic and atraumatic.  Eyes:     General:        Right eye: No discharge.        Left eye: No discharge.     Conjunctiva/sclera: Conjunctivae normal.  Cardiovascular:     Rate and Rhythm: Normal rate and regular rhythm.  Pulmonary:     Effort: Pulmonary effort is normal.     Breath sounds: Normal breath sounds. No wheezing, rhonchi or rales.     Comments: Anterior chest wall tenderness as well as tenderness over the lateral ribs (mid axillary line at the level of the bra) Neurological:     Mental Status: She is alert.  Psychiatric:        Mood and Affect: Mood normal.        Behavior: Behavior normal.    UC  Treatments / Results  Labs (all labs ordered are listed, but only abnormal results are displayed) Labs Reviewed - No data to display  EKG   Radiology Dg Chest 2 View  Result Date: 06/23/2019 CLINICAL DATA:  Left-sided rib pain short of breath history of lung cancer EXAM: CHEST - 2 VIEW COMPARISON:  None. FINDINGS: Hyperinflated lungs with probable emphysematous disease. Mild architectural distortion and linear opacity radiating from left hilum likely due to focal scarring. No acute airspace disease or pleural effusion. Normal heart size. No pneumothorax. Probable old left fourth  and fifth rib fractures. Age indeterminate displaced left sixth rib fracture. Treated compression deformity of the lower spine. IMPRESSION: 1. Hyperinflated lungs with probable emphysematous disease and linear scarring in the left mid lung 2. Old appearing left upper rib fractures with age indeterminate displaced left sixth rib fracture Electronically Signed   By: Donavan Foil M.D.   On: 06/23/2019 19:03    Procedures Procedures (including critical care time)  Medications Ordered in UC Medications - No data to display  Initial Impression / Assessment and Plan / UC Course  I have reviewed the triage vital signs and the nursing notes.  Pertinent labs & imaging results that were available during my care of the patient were reviewed by me and considered in my medical decision making (see chart for details).    58 year old female presents with chest wall/rib pain.  X-ray revealed hyperinflated lungs and old left fourth and fifth rib fractures.  Indeterminate age left sixth rib fracture.  I believe this is the source of her pain.  She has had no fall, trauma, or injury.  She has known osteoporosis and has not been coughing.  There is no other inciting factor than possible progression of her underlying cancer.  I have advised her to follow-up with oncology.  She will take her home Dilaudid as needed for pain.  Final  Clinical Impressions(s) / UC Diagnoses   Final diagnoses:  Closed fracture of multiple ribs of left side, initial encounter     Discharge Instructions     Call Oncology. You need to be seen soon (before originally scheduled visit).  Dilaudid as needed.  Take care  Dr. Lacinda Axon     ED Prescriptions    None     I have reviewed the PDMP during this encounter.   Coral Spikes, [place] 06/23/19 1954

## 2019-07-25 DIAGNOSIS — R110 Nausea: Secondary | ICD-10-CM | POA: Insufficient documentation

## 2019-11-11 ENCOUNTER — Ambulatory Visit: Payer: Commercial Managed Care - PPO | Attending: Internal Medicine

## 2019-11-11 DIAGNOSIS — Z20822 Contact with and (suspected) exposure to covid-19: Secondary | ICD-10-CM

## 2020-08-14 ENCOUNTER — Encounter: Payer: Self-pay | Admitting: Emergency Medicine

## 2020-08-14 ENCOUNTER — Ambulatory Visit
Admission: EM | Admit: 2020-08-14 | Discharge: 2020-08-14 | Disposition: A | Discharge status: Home / Self Care | Payer: Commercial Managed Care - PPO | Attending: Physician Assistant | Admitting: Physician Assistant

## 2020-08-14 ENCOUNTER — Other Ambulatory Visit: Payer: Self-pay

## 2020-08-14 ENCOUNTER — Ambulatory Visit (INDEPENDENT_AMBULATORY_CARE_PROVIDER_SITE_OTHER): Payer: Commercial Managed Care - PPO

## 2020-08-14 DIAGNOSIS — J439 Emphysema, unspecified: Secondary | ICD-10-CM | POA: Insufficient documentation

## 2020-08-14 DIAGNOSIS — R0602 Shortness of breath: Secondary | ICD-10-CM | POA: Diagnosis present

## 2020-08-14 DIAGNOSIS — C3432 Malignant neoplasm of lower lobe, left bronchus or lung: Secondary | ICD-10-CM

## 2020-08-14 DIAGNOSIS — Z79899 Other long term (current) drug therapy: Secondary | ICD-10-CM | POA: Insufficient documentation

## 2020-08-14 DIAGNOSIS — M549 Dorsalgia, unspecified: Secondary | ICD-10-CM

## 2020-08-14 DIAGNOSIS — J189 Pneumonia, unspecified organism: Secondary | ICD-10-CM | POA: Diagnosis present

## 2020-08-14 DIAGNOSIS — Z85118 Personal history of other malignant neoplasm of bronchus and lung: Secondary | ICD-10-CM | POA: Diagnosis not present

## 2020-08-14 DIAGNOSIS — J90 Pleural effusion, not elsewhere classified: Secondary | ICD-10-CM | POA: Diagnosis not present

## 2020-08-14 DIAGNOSIS — Z20822 Contact with and (suspected) exposure to covid-19: Secondary | ICD-10-CM | POA: Insufficient documentation

## 2020-08-14 LAB — RESP PANEL BY RT-PCR (FLU A&B, COVID) ARPGX2
Influenza A by PCR: NEGATIVE
Influenza B by PCR: NEGATIVE
SARS Coronavirus 2 by RT PCR: NEGATIVE

## 2020-08-14 MED ORDER — LEVOFLOXACIN 750 MG PO TABS: 750.0000 mg | ORAL_TABLET | Freq: Every day | ORAL | 0 refills | Status: AC

## 2020-08-14 MED ORDER — LEVOFLOXACIN 750 MG PO TABS: 750.0000 mg | ORAL_TABLET | Freq: Every day | ORAL | 0 refills | Status: DC

## 2020-08-14 NOTE — ED Provider Notes (Signed)
[facility]    CSN: [id number] Arrival date & time: 08/14/20  0932      History   Chief Complaint Chief Complaint  Patient presents with  . Shortness of Breath  . Back Pain    HPI Nichole Zhang is a 59 y.o. female with stage IV NSCLC lung cancer (left lower lobe) and brain metastasis status post chemo/XRT.  Patient also has cancer related back pain.  Patient is on palliative care.  Patient presents for onset of left sided back pain and increased pain on breathing since last night.  Patient is on supplemental O2 and is currently using 2.5 L.  She says that she normally does not have to use oxygen very often, only if she is exerting herself.  She says she has been using it consistently since last night at this time.  Patient takes hydromorphone 4 mg PO q3-4 hr PRN about 3-4 days per week as well as gabapentin for pain management.  Patient says she took 1 hydromorphone about 7 to 8 hours ago.  Patient says that "something ain't right" with her back pain.  She says that it is worse than normal.  Admits to increased pain on breathing.  States that the pain is intermittent.  She does have pain when she presses on her left side and left back.  Patient says this is normally where she has pain, but it is worse.  Admits to sharp pains every now and then.  Denies any radiation of pain to the left arm, neck, jaw.  Denies any associated dizziness, weakness, palpitations.  Additionally, patient denies any history of cardiac disease.  Denies any history of DVT or PE.  Patient also states that she has history of broken ribs on the left side.  Denies any new injury.  Patient is fully vaccinated for COVID 19 and received booster about 3 months ago.  She does have previous history of COVID-19 in November 2020.  She states she had a mild case.  Patient denies any recent known COVID-19 exposure.  Denies any fever, body aches, sore throat, nasal congestion, headaches, chest pain, abdominal pain,  nausea/vomiting/diarrhea, or change in smell or taste.  Patient denies any other complaints or concerns this time.  HPI  Past Medical History:  Diagnosis Date  . Cancer ([place])     There are no problems to display for this patient.   Past Surgical History:  Procedure Laterality Date  . GALLBLADDER SURGERY      OB History   No obstetric history on file.      Home Medications    Prior to Admission medications   Medication Sig Start Date End Date Taking? Authorizing Provider  dexamethasone (DECADRON) 0.5 MG tablet TAKE 1 TABLET (0.5 MG TOTAL) BY MOUTH DAILY WITH BREAKFAST FOR 90 DAYS RESTART ON 4/19 05/22/19  Yes [provider]  folic acid (FOLVITE) 1 MG tablet TAKE 1 TABLET BY MOUTH EVERY DAY 05/20/19  Yes [provider]  HYDROcodone-acetaminophen (NORCO/VICODIN) 5-325 MG tablet Take by mouth. 05/17/15  Yes [provider]  LORazepam (ATIVAN) 0.5 MG tablet Take by mouth. 05/30/19  Yes [provider]  methocarbamol (ROBAXIN) 500 MG tablet Take by mouth. 04/09/19  Yes [provider]  pseudoephedrine (SUDAFED) 60 MG tablet Take 1 tablet (60 mg total) by mouth every 6 (six) hours as needed for congestion. 02/23/17  Yes Duanne Guess, PA-C  Vitamin D, Ergocalciferol, (DRISDOL) 1.25 MG (50000 UT) CAPS capsule Take 1 capsule by  mouth weekly for 12 weeks 10/12/18  Yes [provider]  cloNIDine (CATAPRES) 0.1 MG tablet Take by mouth. 11/09/18 11/09/19  [provider]  levofloxacin (LEVAQUIN) 750 MG tablet Take 1 tablet (750 mg total) by mouth daily for 5 days. 08/14/20 08/19/20  Laurene Footman B, PA-C  OLANZapine (ZYPREXA) 5 MG tablet Take by mouth. 10/02/18 12/31/18  [provider]  simvastatin (ZOCOR) 10 MG tablet Take by mouth. 04/26/19 04/25/20  [provider]    Family History Family History  Problem Relation Age of Onset  . Breast cancer Neg Hx     Social History Social History   Tobacco Use  .  Smoking status: Never Smoker  . Smokeless tobacco: Never Used  Vaping Use  . Vaping Use: Never used  Substance Use Topics  . Alcohol use: No  . Drug use: No     Allergies   Patient has no known allergies.   Review of Systems Review of Systems  Constitutional: Positive for fatigue. Negative for chills, diaphoresis and fever.  HENT: Negative for congestion, ear pain, rhinorrhea, sinus pressure, sinus pain and sore throat.   Respiratory: Positive for shortness of breath. Negative for cough.   Cardiovascular: Positive for chest pain. Negative for palpitations.  Gastrointestinal: Negative for abdominal pain, nausea and vomiting.  Musculoskeletal: Positive for back pain. Negative for arthralgias and myalgias.  Skin: Negative for rash.  Neurological: Negative for weakness and headaches.  Hematological: Negative for adenopathy.     Physical Exam Triage Vital Signs ED Triage Vitals  Enc Vitals Group     BP 08/14/20 1000 (!) 141/94     Pulse Rate 08/14/20 1000 90     Resp 08/14/20 1000 20     Temp 08/14/20 1000 98.1 F (36.7 C)     Temp Source 08/14/20 1000 Oral     SpO2 08/14/20 1000 96 %     Weight 08/14/20 0956 180 lb (81.6 kg)     Height 08/14/20 0956 5\' 7"  (1.702 m)     Head Circumference --      Peak Flow --      Pain Score 08/14/20 0955 10     Pain Loc --      Pain Edu? --      Excl. in [place]? --    No data found.  Updated Vital Signs BP (!) 141/94 (BP Location: Right Arm)   Pulse 90   Temp 98.1 F (36.7 C) (Oral)   Resp 20   Ht 5\' 7"  (1.702 m)   Wt 180 lb (81.6 kg)   SpO2 96%   BMI 28.19 kg/m       Physical Exam Vitals and nursing note reviewed.  Constitutional:      General: She is not in acute distress.    Appearance: Normal appearance. She is not ill-appearing or toxic-appearing.  HENT:     Head: Normocephalic and atraumatic.     Nose: Nose normal.     Mouth/Throat:     Mouth: Mucous membranes are moist.     Pharynx: Oropharynx is clear.  Eyes:      General: No scleral icterus.       Right eye: No discharge.        Left eye: No discharge.     Conjunctiva/sclera: Conjunctivae normal.  Cardiovascular:     Rate and Rhythm: Normal rate and regular rhythm.     Heart sounds: Normal heart sounds.  Pulmonary:     Effort: Pulmonary effort is  normal. No respiratory distress.     Breath sounds: Examination of the left-lower field reveals decreased breath sounds. Decreased breath sounds present. No wheezing, rhonchi or rales.  Chest:     Chest wall: Tenderness (left lateral ribs 4-6) present.  Musculoskeletal:     Cervical back: Neck supple.     Thoracic back: Tenderness (left parathoracic region diffusely) present.     Right lower leg: No edema.     Left lower leg: No edema.  Skin:    General: Skin is dry.  Neurological:     General: No focal deficit present.     Mental Status: She is alert. Mental status is at baseline.     Motor: No weakness.     Gait: Gait normal.  Psychiatric:        Mood and Affect: Mood normal.        Behavior: Behavior normal.        Thought Content: Thought content normal.      UC Treatments / Results  Labs (all labs ordered are listed, but only abnormal results are displayed) Labs Reviewed  RESP PANEL BY RT-PCR (FLU A&B, COVID) [id number]    EKG   Radiology DG Chest 2 View  Result Date: 08/14/2020 CLINICAL DATA:  Left-sided back pain and shortness of breath. History of stage IV lung cancer. EXAM: CHEST - 2 VIEW COMPARISON:  06/23/2019 FINDINGS: The cardiomediastinal silhouette is unchanged with normal heart size. Lung volumes are lower than on the prior study. There is chronic linear opacity in the left mid lung suggesting scarring. There are new small left and trace right pleural effusions, and there is mild airspace opacity in the left lung base. There is underlying emphysema. There are chronic posterior left fourth, fifth, and sixth rib fractures with increased displacement compared to the prior  study. Chronic thoracic compression fractures are again noted, and there is been prior augmentation of a compression fracture near the thoracolumbar junction. IMPRESSION: New small left and trace right pleural effusions with mild left basilar opacity, which may reflect atelectasis or early infection. Electronically Signed   By: Logan Bores M.D.   On: 08/14/2020 10:34    Procedures Procedures (including critical care time)  Medications Ordered in UC Medications - No data to display  Initial Impression / Assessment and Plan / UC Course  I have reviewed the triage vital signs and the nursing notes.  Pertinent labs & imaging results that were available during my care of the patient were reviewed by me and considered in my medical decision making (see chart for details).   59 year old female with history of stage IV lung cancer (specifically mass of the left lower lobe)  presenting for onset of increased left-sided back pain and shortness of breath last night.  Vital signs are stable.  Oxygen is 96% on 2.5 to 3 L oxygen.  When patient has been taken off of the oxygen in the exam room, oxygen dose decreased to 92 to 93%.  She is not ill-appearing.  She is in no acute distress.  Breath sounds decreased in the left lower lobe, but no wheezes, rales, or rhonchi.  She does have tenderness to palpation of the left thoracic region as well as left lateral ribs.  Chest x-ray shows "New small left and trace right pleural effusions with mild left basilar opacity, which may reflect atelectasis or early infection."  Chest x-ray independently reviewed by me.  Respiratory panel obtained at patient request.  Advised patient we will call  if respiratory panel positive.  Advised patient chest x-ray does seem consistent with small early left-sided pneumonia.  Treating with Levaquin as she has comorbidities.  Advise increasing rest and fluids.  Patient does have an appointment with her pulmonologist tomorrow.  Advised  her to keep the appointment.    Red flag symptoms and ED precautions discussed at length with patient.  Advised her to go to ED if her pain is not adequately managed at home with her hydromorphone.  Advised ED if she has any pain in her chest or increased breathing difficulty.  Patient understanding and agreeable.  Respiratory panel normal.   Final Clinical Impressions(s) / UC Diagnoses   Final diagnoses:  Pneumonia of left lower lobe due to infectious organism  Shortness of breath  Malignant neoplasm of lower lobe of left lung [facility])     Discharge Instructions     CXR shows possible early infection of left lower lung. This could be early pneumonia. Begin antibiotics and continue using oxygen. Keep appointment with pulmonologist tomorrow.  Go to ED if you develop worsening of your pain, your pain is not managed by at-home medications, increased breathing difficulty or weakness, pain that radiates to your chest/neck/jaw, dizziness or feeling faint.    ED Prescriptions    Medication Sig Dispense Auth. Provider   levofloxacin (LEVAQUIN) 750 MG tablet  (Status: Discontinued) Take 1 tablet (750 mg total) by mouth daily for 5 days. 5 tablet Laurene Footman B, PA-C   levofloxacin (LEVAQUIN) 750 MG tablet  (Status: Discontinued) Take 1 tablet (750 mg total) by mouth daily for 5 days. 5 tablet Laurene Footman B, PA-C   levofloxacin (LEVAQUIN) 750 MG tablet Take 1 tablet (750 mg total) by mouth daily for 5 days. 5 tablet Danton Clap, PA-C     I have reviewed the PDMP during this encounter.   Danton Clap, PA-C 08/14/20 1424

## 2020-08-14 NOTE — Discharge Instructions (Addendum)
CXR shows possible early infection of left lower lung. This could be early pneumonia. Begin antibiotics and continue using oxygen. Keep appointment with pulmonologist tomorrow.  Go to ED if you develop worsening of your pain, your pain is not managed by at-home medications, increased breathing difficulty or weakness, pain that radiates to your chest/neck/jaw, dizziness or feeling faint.

## 2020-08-14 NOTE — ED Triage Notes (Signed)
Patient requesting CXR for pneumonia. She states she has stage IV lung cancer and she is having pain in her left back area. She states this is affecting her breathing.

## 2020-10-11 ENCOUNTER — Ambulatory Visit (INDEPENDENT_AMBULATORY_CARE_PROVIDER_SITE_OTHER): Payer: Medicare Other

## 2020-10-11 ENCOUNTER — Ambulatory Visit
Admission: EM | Admit: 2020-10-11 | Discharge: 2020-10-11 | Disposition: A | Discharge status: Home / Self Care | Payer: Medicare Other

## 2020-10-11 ENCOUNTER — Other Ambulatory Visit: Payer: Self-pay

## 2020-10-11 ENCOUNTER — Encounter: Payer: Self-pay | Admitting: Emergency Medicine

## 2020-10-11 DIAGNOSIS — R0602 Shortness of breath: Secondary | ICD-10-CM | POA: Diagnosis not present

## 2020-10-11 DIAGNOSIS — C3490 Malignant neoplasm of unspecified part of unspecified bronchus or lung: Secondary | ICD-10-CM | POA: Diagnosis not present

## 2020-10-11 DIAGNOSIS — Z85118 Personal history of other malignant neoplasm of bronchus and lung: Secondary | ICD-10-CM

## 2020-10-11 DIAGNOSIS — J9383 Other pneumothorax: Secondary | ICD-10-CM

## 2020-10-11 NOTE — Discharge Instructions (Addendum)
Go to the ER right now  Here is the report of your chest xray: EXAM: CHEST - 2 VIEW   COMPARISON:  08/14/2020.   FINDINGS: Mediastinum and hilar structures normal. Heart size normal. Moderate left pneumothorax noted on today's exam. Persistent left base atelectasis. Persistent small left pleural effusion. Degenerative changes scoliosis thoracic spine. Stable midthoracic vertebral body compression fracture. Prior lower thoracic vertebroplasty. Left rib fractures unchanged.   IMPRESSION: 1. Moderate left pneumothorax noted on today's exam. 2. Persistent left base atelectasis and small left pleural effusion.   Critical Value/emergent results were called by telephone at the time of interpretation on 10/11/2020 at 9:30 am to provider [facility] , who verbally acknowledged these results.     Electronically Signed   By: Marcello Moores  Register   On: 10/11/2020 09:30

## 2020-10-11 NOTE — ED Triage Notes (Signed)
Patient in today c/o left sided chest/lung pain and sob x 1 day. Patient states she has stage IV lung cancer and requesting a CXR to check for pneumonia. Patient states her pulse ox was 68% on room air at ~1am this morning. Patient put her oxygen on at 2-3 liters by nasal canula and pulse ox went to 93-96%. Patient is on oxygen now.

## 2020-10-11 NOTE — ED Provider Notes (Signed)
[facility]    CSN: [id number] Arrival date & time: 10/11/20  0810      History   Chief Complaint Chief Complaint  Patient presents with  . Shortness of Breath  . Chest Pain    HPI Nichole Zhang is a 60 y.o. female who presents with SOB and L thoracic chest pain. States seh woke up with pain on this area and SOB. She checked her pulse ox and was 68%. She put herself on her O2 which she only uses it prn and kept it at 2-3 L and pulse ox went back up to 93-96%. She was treated for LLL pneumonia in December of 2021 and followed up with her pulmonologist a few days later to extended her antibiotic for 10 days. She had repeated xray and was told the pneumonia had resolved. Pt feels she has pneumonia in this area again. Denies fever, chills or sweats which she does not get when she gets pneumonia.     Past Medical History:  Diagnosis Date  . Cancer ([place])    stage IV lung    There are no problems to display for this patient.   Past Surgical History:  Procedure Laterality Date  . BACK SURGERY     T12  . CHOLECYSTECTOMY    . GALLBLADDER SURGERY      OB History   No obstetric history on file.      Home Medications    Prior to Admission medications   Medication Sig Start Date End Date Taking? Authorizing Provider  alendronate (FOSAMAX) 70 MG tablet Take 70 mg by mouth once a week. 08/25/20  Yes [provider]  ANORO ELLIPTA 62.5-25 MCG/INH AEPB Inhale 1 puff into the lungs daily. 09/10/20  Yes [provider]  Calcium Carbonate-Vitamin D 600-400 MG-UNIT tablet Take 1 tablet by mouth daily.   Yes [provider]  dexamethasone (DECADRON) 0.5 MG tablet Take 1 tablet by mouth daily. 09/03/20  Yes [provider]  fluticasone (FLONASE) 50 MCG/ACT nasal spray SMARTSIG:1-2 Spray(s) Both Nares Daily PRN 08/31/20  Yes [provider]  gabapentin (NEURONTIN) 300 MG capsule SMARTSIG:1-2 Capsule(s) By Mouth Every Evening  08/31/20  Yes [provider]  HYDROmorphone (DILAUDID) 4 MG tablet Take 4 mg by mouth every 4 (four) hours as needed. 08/25/20  Yes [provider]  OLANZapine (ZYPREXA) 2.5 MG tablet Take 2.5 mg by mouth at bedtime. 08/27/20  Yes [provider]  simvastatin (ZOCOR) 10 MG tablet Take by mouth. 04/26/19 10/11/20 Yes [provider]  venlafaxine XR (EFFEXOR-XR) 75 MG 24 hr capsule Take 75 mg by mouth daily. 09/17/20  Yes [provider]  Vitamin D, Ergocalciferol, (DRISDOL) 1.25 MG (50000 UT) CAPS capsule Take 1 capsule by mouth weekly for 12 weeks 10/12/18  Yes [provider]  cloNIDine (CATAPRES) 0.1 MG tablet Take by mouth. 11/09/18 11/09/19  [provider]  dexamethasone (DECADRON) 0.5 MG tablet TAKE 1 TABLET (0.5 MG TOTAL) BY MOUTH DAILY WITH BREAKFAST FOR 90 DAYS RESTART ON 4/19 05/22/19   [provider]  folic acid (FOLVITE) 1 MG tablet TAKE 1 TABLET BY MOUTH EVERY DAY 05/20/19   [provider]  LORazepam (ATIVAN) 0.5 MG tablet Take by mouth. 05/30/19   [provider]  methocarbamol (ROBAXIN) 500 MG tablet Take by mouth. 04/09/19   [provider]  OLANZapine (ZYPREXA) 5 MG tablet Take by mouth. 10/02/18 12/31/18  [provider]  pseudoephedrine (SUDAFED) 60 MG tablet Take 1  tablet (60 mg total) by mouth every 6 (six) hours as needed for congestion. 02/23/17   Duanne Guess, PA-C    Family History Family History  Problem Relation Age of Onset  . Heart attack Mother   . Hypertension Mother   . Hypertension Father   . Hyperlipidemia Father   . Breast cancer Neg Hx     Social History Social History   Tobacco Use  . Smoking status: Never Smoker  . Smokeless tobacco: Never Used  Vaping Use  . Vaping Use: Never used  Substance Use Topics  . Alcohol use: No  . Drug use: No     Allergies   Patient has no known allergies.   Review of Systems Review of Systems  Constitutional:  Negative for chills, diaphoresis and fever.  HENT: Negative for congestion, ear discharge, ear pain, postnasal drip and rhinorrhea.   Respiratory: Positive for cough and shortness of breath.   Cardiovascular: Positive for chest pain.  Musculoskeletal: Negative for gait problem and myalgias.  Skin: Negative for rash.  Neurological: Negative for headaches.     Physical Exam Triage Vital Signs ED Triage Vitals  Enc Vitals Group     BP 10/11/20 0841 (!) 162/107     Pulse Rate 10/11/20 0841 92     Resp 10/11/20 0841 18     Temp 10/11/20 0841 98.4 F (36.9 C)     Temp Source 10/11/20 0841 Oral     SpO2 10/11/20 0841 95 %     Weight 10/11/20 0843 187 lb (84.8 kg)     Height 10/11/20 0843 5\' 7"  (1.702 m)     Head Circumference --      Peak Flow --      Pain Score 10/11/20 0842 10     Pain Loc --      Pain Edu? --      Excl. in [place]? --    No data found.  Updated Vital Signs BP 124/90 (BP Location: Left Arm)   Pulse 92   Temp 98.4 F (36.9 C) (Oral)   Resp 18   Ht 5\' 7"  (1.702 m)   Wt 187 lb (84.8 kg)   SpO2 95%   BMI 29.29 kg/m   Visual Acuity Right Eye Distance:   Left Eye Distance:   Bilateral Distance:    Right Eye Near:   Left Eye Near:    Bilateral Near:     Physical Exam Vitals and nursing note reviewed.  Constitutional:      General: She is not in acute distress.    Appearance: She is not toxic-appearing.     Comments: Has O2 on and does not appear SOB  HENT:     Head: Normocephalic.  Eyes:     Extraocular Movements: Extraocular movements intact.  Cardiovascular:     Rate and Rhythm: Normal rate and regular rhythm.     Heart sounds: No murmur heard.   Pulmonary:     Effort: Pulmonary effort is normal.     Comments: She has decreased breath sounds of L  lung with dullness on this areas. R lung sounds clear. Egophony just sounds very distant on L lung, but no change to A Chest:     Chest wall: Tenderness present.     Comments: On R lateral rib area  where she fractured them Musculoskeletal:        General: Normal range of motion.     Cervical back: Normal range of motion.  Skin:  General: Skin is warm and dry.     Findings: No ecchymosis, erythema or rash.  Neurological:     Mental Status: She is alert and oriented to person, place, and time.  Psychiatric:        Mood and Affect: Mood normal.        Behavior: Behavior normal.      UC Treatments / Results  Labs (all labs ordered are listed, but only abnormal results are displayed) Labs Reviewed - No data to display  EKG   Radiology No results found.  Procedures Procedures (including critical care time)  Medications Ordered in UC Medications - No data to display  Initial Impression / Assessment and Plan / UC Course  I have reviewed the triage vital signs and the nursing notes. Pertinent  imaging results that were available during my care of the patient were reviewed by me and considered in my medical decision making (see chart for details). L pneumothorax. Sent to [facility] ER per her preference. She has her O2 tank and is on O2, so her husband will take her.  Final Clinical Impressions(s) / UC Diagnoses   Final diagnoses:  None   Discharge Instructions   None    ED Prescriptions    None     PDMP not reviewed this encounter.   Shelby Mattocks, PA-C 10/11/20 1136

## 2021-03-18 DIAGNOSIS — E7800 Pure hypercholesterolemia, unspecified: Secondary | ICD-10-CM | POA: Insufficient documentation

## 2021-03-18 DIAGNOSIS — F4321 Adjustment disorder with depressed mood: Secondary | ICD-10-CM | POA: Insufficient documentation

## 2021-03-18 DIAGNOSIS — I10 Essential (primary) hypertension: Secondary | ICD-10-CM | POA: Insufficient documentation

## 2021-04-08 ENCOUNTER — Ambulatory Visit
Admission: EM | Admit: 2021-04-08 | Discharge: 2021-04-08 | Disposition: A | Discharge status: Home / Self Care | Payer: Medicare Other | Attending: Family Medicine | Admitting: Family Medicine

## 2021-04-08 ENCOUNTER — Other Ambulatory Visit: Payer: Self-pay

## 2021-04-08 DIAGNOSIS — R1030 Lower abdominal pain, unspecified: Secondary | ICD-10-CM

## 2021-04-08 MED ORDER — METRONIDAZOLE 500 MG PO TABS: 500.0000 mg | ORAL_TABLET | Freq: Three times a day (TID) | ORAL | 0 refills | Status: AC

## 2021-04-08 MED ORDER — ONDANSETRON 4 MG PO TBDP: 4.0000 mg | ORAL_TABLET | Freq: Three times a day (TID) | ORAL | 0 refills | Status: AC | PRN

## 2021-04-08 MED ORDER — CIPROFLOXACIN HCL 500 MG PO TABS: 500.0000 mg | ORAL_TABLET | Freq: Two times a day (BID) | ORAL | 0 refills | Status: DC

## 2021-04-08 NOTE — Discharge Instructions (Addendum)
Take the antibiotics as directed.  If you fail to improve or worsen in the next 24 to 48 hours you need to go directly to the emergency room (fever, severe diarrhea, intractable nausea and vomiting, etc.).  Take care  Dr. Lacinda Axon

## 2021-04-08 NOTE — ED Triage Notes (Signed)
Pt c/o lower abdominal pain since yesterday. Pt also reports weakness and some nausea. Pt denies f/v/d, constipation, urinary symptoms or other symptoms.

## 2021-04-10 NOTE — ED Provider Notes (Signed)
[facility]    CSN: [id number] Arrival date & time: 04/08/21  1453      History   Chief Complaint Chief Complaint  Patient presents with   Abdominal Pain    lower    HPI  60 year old female with a past medical history of hypertension, diverticulitis, COPD/emphysema on chronic O2, metastatic lung cancer presents with abdominal pain.  Started yesterday.  She reports diffuse lower abdominal pain.  Associated generalized weakness and nausea.  Denies fever.  She has not yet had any significant diarrhea.  Pain 8/10 in severity.  No relieving factors.  Patient feels very poorly.  No other complaints or concerns at this time.  Past Medical History:  Diagnosis Date   Cancer ([place])    stage IV lung   Past Surgical History:  Procedure Laterality Date   BACK SURGERY     T12   CHOLECYSTECTOMY     GALLBLADDER SURGERY      OB History   No obstetric history on file.      Home Medications    Prior to Admission medications   Medication Sig Start Date End Date Taking? Authorizing Provider  alendronate (FOSAMAX) 70 MG tablet Take 70 mg by mouth once a week. 08/25/20  Yes [provider]  ANORO ELLIPTA 62.5-25 MCG/INH AEPB Inhale 1 puff into the lungs daily. 09/10/20  Yes [provider]  butalbital-acetaminophen-caffeine (FIORICET) 50-325-40 MG tablet Take 1 tablet by mouth every 6 (six) hours as needed. 01/18/21  Yes [provider]  Calcium Carbonate-Vitamin D 600-400 MG-UNIT tablet Take 1 tablet by mouth daily.   Yes [provider]  ciprofloxacin (CIPRO) 500 MG tablet Take 1 tablet (500 mg total) by mouth 2 (two) times daily. 04/08/21  Yes Quintan Saldivar G, DO  dexamethasone (DECADRON) 2 MG tablet Take by mouth. 01/26/21  Yes [provider]  fluticasone (FLONASE) 50 MCG/ACT nasal spray SMARTSIG:1-2 Spray(s) Both Nares Daily PRN 08/31/20  Yes [provider]  gabapentin (NEURONTIN) 300 MG capsule SMARTSIG:1-2 Capsule(s) By  Mouth Every Evening 08/31/20  Yes [provider]  HYDROmorphone (DILAUDID) 4 MG tablet Take 4 mg by mouth every 4 (four) hours as needed. 08/25/20  Yes [provider]  losartan-hydrochlorothiazide (HYZAAR) 100-25 MG tablet Take 1 tablet by mouth daily. 03/13/21  Yes [provider]  metroNIDAZOLE (FLAGYL) 500 MG tablet Take 1 tablet (500 mg total) by mouth 3 (three) times daily for 7 days. 04/08/21 04/15/21 Yes Ryman Rathgeber G, DO  OLANZapine (ZYPREXA) 2.5 MG tablet Take 2.5 mg by mouth at bedtime. 08/27/20  Yes [provider]  ondansetron (ZOFRAN ODT) 4 MG disintegrating tablet Take 1 tablet (4 mg total) by mouth every 8 (eight) hours as needed for nausea or vomiting. 04/08/21  Yes Coral Spikes, DO  pantoprazole (PROTONIX) 40 MG tablet Take by mouth. 01/18/21 01/18/22 Yes [provider]  simvastatin (ZOCOR) 10 MG tablet Take by mouth. 03/12/21  Yes [provider]  venlafaxine XR (EFFEXOR-XR) 75 MG 24 hr capsule Take 75 mg by mouth daily. 09/17/20  Yes [provider]  Vitamin D, Ergocalciferol, (DRISDOL) 1.25 MG (50000 UT) CAPS capsule Take 1 capsule by mouth weekly for 12 weeks 10/12/18  Yes [provider]  OLANZapine (ZYPREXA) 5 MG tablet Take by mouth. 10/02/18 12/31/18  [provider]  cloNIDine (CATAPRES) 0.1 MG tablet Take by mouth. 11/09/18 10/11/20  [provider]    Family History Family History  Problem Relation Age of Onset  Heart attack Mother    Hypertension Mother    Hypertension Father    Hyperlipidemia Father    Breast cancer Neg Hx     Social History Social History   Tobacco Use   Smoking status: Never   Smokeless tobacco: Never  Vaping Use   Vaping Use: Never used  Substance Use Topics   Alcohol use: No   Drug use: No     Allergies   Patient has no known allergies.   Review of Systems Review of Systems Per HPI  Physical Exam Triage Vital Signs ED Triage Vitals  Enc Vitals  Group     BP 04/08/21 1523 117/90     Pulse Rate 04/08/21 1523 (!) 109     Resp 04/08/21 1523 18     Temp 04/08/21 1523 98.7 F (37.1 C)     Temp Source 04/08/21 1523 Oral     SpO2 04/08/21 1523 93 %     Weight 04/08/21 1521 200 lb (90.7 kg)     Height 04/08/21 1521 5\' 6"  (1.676 m)     Head Circumference --      Peak Flow --      Pain Score 04/08/21 1520 8     Pain Loc --      Pain Edu? --      Excl. in [place]? --    Updated Vital Signs BP 117/90 (BP Location: Left Arm)   Pulse (!) 109   Temp 98.7 F (37.1 C) (Oral)   Resp 18   Ht 5\' 6"  (1.676 m)   Wt 90.7 kg   SpO2 93%   BMI 32.28 kg/m   Visual Acuity Right Eye Distance:   Left Eye Distance:   Bilateral Distance:    Right Eye Near:   Left Eye Near:    Bilateral Near:     Physical Exam Vitals and nursing note reviewed.  Constitutional:      General: She is not in acute distress.    Comments: Chronically ill appearing.   HENT:     Head: Normocephalic and atraumatic.  Eyes:     General:        Right eye: No discharge.        Left eye: No discharge.     Conjunctiva/sclera: Conjunctivae normal.  Cardiovascular:     Rate and Rhythm: Regular rhythm. Tachycardia present.  Pulmonary:     Effort: No respiratory distress.  Abdominal:     Palpations: Abdomen is soft.     Comments: Tender in the suprapubic region and RLQ.  Neurological:     Mental Status: She is alert.    UC Treatments / Results  Labs (all labs ordered are listed, but only abnormal results are displayed) Labs Reviewed - No data to display  EKG   Radiology No results found.  Procedures Procedures (including critical care time)  Medications Ordered in UC Medications - No data to display  Initial Impression / Assessment and Plan / UC Course  I have reviewed the triage vital signs and the nursing notes.  Pertinent labs & imaging results that were available during my care of the patient were reviewed by me and considered in my medical  decision making (see chart for details).    60 year old female presents with lower abdominal pain.  Given her history of diverticulitis, we discussed going to the hospital for labs and imaging as I do not have the capability to do this here versus empiric treatment.  Patient elected to try  a trial of empiric treatment with the understanding that if she worsens or fails to improve that she is to go directly to the emergency room.  Patient is in agreement.  Placing on Cipro, Flagyl, Zofran as needed for nausea.  Final Clinical Impressions(s) / UC Diagnoses   Final diagnoses:  Lower abdominal pain     Discharge Instructions      Take the antibiotics as directed.  If you fail to improve or worsen in the next 24 to 48 hours you need to go directly to the emergency room (fever, severe diarrhea, intractable nausea and vomiting, etc.).  Take care  Dr. Lacinda Axon      ED Prescriptions     Medication Sig Dispense Auth. Provider   ciprofloxacin (CIPRO) 500 MG tablet Take 1 tablet (500 mg total) by mouth 2 (two) times daily. 14 tablet Jersee Winiarski G, DO   metroNIDAZOLE (FLAGYL) 500 MG tablet Take 1 tablet (500 mg total) by mouth 3 (three) times daily for 7 days. 21 tablet Norissa Bartee G, DO   ondansetron (ZOFRAN ODT) 4 MG disintegrating tablet Take 1 tablet (4 mg total) by mouth every 8 (eight) hours as needed for nausea or vomiting. 20 tablet Coral Spikes, DO      PDMP not reviewed this encounter.   Thersa Salt [place], [place] 04/10/21 [phone]

## 2021-04-24 DIAGNOSIS — N9489 Other specified conditions associated with female genital organs and menstrual cycle: Secondary | ICD-10-CM | POA: Insufficient documentation

## 2021-06-15 DIAGNOSIS — F40240 Claustrophobia: Secondary | ICD-10-CM | POA: Insufficient documentation

## 2021-06-15 DIAGNOSIS — F1190 Opioid use, unspecified, uncomplicated: Secondary | ICD-10-CM | POA: Insufficient documentation

## 2021-06-15 DIAGNOSIS — Z9981 Dependence on supplemental oxygen: Secondary | ICD-10-CM | POA: Insufficient documentation

## 2021-07-09 ENCOUNTER — Ambulatory Visit (INDEPENDENT_AMBULATORY_CARE_PROVIDER_SITE_OTHER): Payer: Medicare Other

## 2021-07-09 ENCOUNTER — Other Ambulatory Visit: Payer: Self-pay

## 2021-07-09 ENCOUNTER — Ambulatory Visit
Admission: EM | Admit: 2021-07-09 | Discharge: 2021-07-09 | Disposition: A | Discharge status: Other Acute Inpatient Hospital | Payer: Medicare Other | Attending: Emergency Medicine | Admitting: Emergency Medicine

## 2021-07-09 DIAGNOSIS — R0602 Shortness of breath: Secondary | ICD-10-CM

## 2021-07-09 DIAGNOSIS — R079 Chest pain, unspecified: Secondary | ICD-10-CM | POA: Diagnosis not present

## 2021-07-09 DIAGNOSIS — J9383 Other pneumothorax: Secondary | ICD-10-CM | POA: Diagnosis not present

## 2021-07-09 NOTE — Discharge Instructions (Addendum)
As we discussed, you have a pneumothorax in the upper part of her left lung which may be new.  You need to be evaluated in the emergency department and monitored for improvement or worsening of your pneumothorax.  I do believe you are safe to drive via POV but I believe it would be better for you to go by ambulance.  I understand your reluctance and wanting to go via private vehicle.

## 2021-07-09 NOTE — ED Provider Notes (Signed)
[facility]    CSN: [id number] Arrival date & time: 07/09/21  1209      History   Chief Complaint Chief Complaint  Patient presents with   Shortness of Breath    HPI Nichole Zhang is a 60 y.o. female.   HPI  60 year old female here for evaluation of respiratory complaints.  Patient reports that she developed shortness of breath and sharp pain in the middle of her left lung 2 days ago.  She also reports that she has been feeling more "drained" and has been experiencing nasal congestion only at night.  She denies any cough, fever, increase in her O2 demand, sore throat, or ear pain.  Patient is not tachypneic and she is able to speak in full sentences without any dyspnea.  Past Medical History:  Diagnosis Date   Cancer ([place])    stage IV lung    There are no problems to display for this patient.   Past Surgical History:  Procedure Laterality Date   BACK SURGERY     T12   CHOLECYSTECTOMY     GALLBLADDER SURGERY      OB History   No obstetric history on file.      Home Medications    Prior to Admission medications   Medication Sig Start Date End Date Taking? Authorizing Provider  alendronate (FOSAMAX) 70 MG tablet Take 70 mg by mouth once a week. 08/25/20   [provider]  ANORO ELLIPTA 62.5-25 MCG/INH AEPB Inhale 1 puff into the lungs daily. 09/10/20   [provider]  butalbital-acetaminophen-caffeine (FIORICET) 50-325-40 MG tablet Take 1 tablet by mouth every 6 (six) hours as needed. 01/18/21   [provider]  Calcium Carbonate-Vitamin D 600-400 MG-UNIT tablet Take 1 tablet by mouth daily.    [provider]  ciprofloxacin (CIPRO) 500 MG tablet Take 1 tablet (500 mg total) by mouth 2 (two) times daily. 04/08/21   Coral Spikes, DO  dexamethasone (DECADRON) 2 MG tablet Take by mouth. 01/26/21   [provider]  fluticasone Asencion Islam) 50 MCG/ACT nasal spray SMARTSIG:1-2 Spray(s) Both Nares Daily PRN  08/31/20   [provider]  gabapentin (NEURONTIN) 300 MG capsule SMARTSIG:1-2 Capsule(s) By Mouth Every Evening 08/31/20   [provider]  HYDROmorphone (DILAUDID) 4 MG tablet Take 4 mg by mouth every 4 (four) hours as needed. 08/25/20   [provider]  losartan-hydrochlorothiazide (HYZAAR) 100-25 MG tablet Take 1 tablet by mouth daily. 03/13/21   [provider]  OLANZapine (ZYPREXA) 2.5 MG tablet Take 2.5 mg by mouth at bedtime. 08/27/20   [provider]  OLANZapine (ZYPREXA) 5 MG tablet Take by mouth. 10/02/18 12/31/18  [provider]  ondansetron (ZOFRAN ODT) 4 MG disintegrating tablet Take 1 tablet (4 mg total) by mouth every 8 (eight) hours as needed for nausea or vomiting. 04/08/21   Coral Spikes, DO  pantoprazole (PROTONIX) 40 MG tablet Take by mouth. 01/18/21 01/18/22  [provider]  simvastatin (ZOCOR) 10 MG tablet Take by mouth. 03/12/21   [provider]  venlafaxine XR (EFFEXOR-XR) 75 MG 24 hr capsule Take 75 mg by mouth daily. 09/17/20   [provider]  Vitamin D, Ergocalciferol, (DRISDOL) 1.25 MG (50000 UT) CAPS capsule Take 1 capsule by mouth weekly for 12 weeks 10/12/18   [provider]  cloNIDine (CATAPRES) 0.1 MG tablet Take by mouth. 11/09/18 10/11/20  [provider]    Family History Family History  Problem Relation Age  of Onset   Heart attack Mother    Hypertension Mother    Hypertension Father    Hyperlipidemia Father    Breast cancer Neg Hx     Social History Social History   Tobacco Use   Smoking status: Never   Smokeless tobacco: Never  Vaping Use   Vaping Use: Never used  Substance Use Topics   Alcohol use: No   Drug use: No     Allergies   Patient has no known allergies.   Review of Systems Review of Systems  Constitutional:  Positive for fatigue.  HENT:  Positive for congestion. Negative for ear pain, rhinorrhea and sore throat.   Respiratory:  Positive  for shortness of breath. Negative for cough.   Cardiovascular:  Positive for chest pain.    Physical Exam Triage Vital Signs ED Triage Vitals  Enc Vitals Group     BP 07/09/21 1302 124/83     Pulse Rate 07/09/21 1302 74     Resp 07/09/21 1302 18     Temp 07/09/21 1302 98.3 F (36.8 C)     Temp Source 07/09/21 1302 Oral     SpO2 07/09/21 1302 99 %     Weight 07/09/21 1301 200 lb (90.7 kg)     Height 07/09/21 1301 5\' 5"  (1.651 m)     Head Circumference --      Peak Flow --      Pain Score 07/09/21 1300 10     Pain Loc --      Pain Edu? --      Excl. in [place]? --    No data found.  Updated Vital Signs BP 124/83 (BP Location: Left Arm)   Pulse 74   Temp 98.3 F (36.8 C) (Oral)   Resp 18   Ht 5\' 5"  (1.651 m)   Wt 200 lb (90.7 kg)   SpO2 99%   BMI 33.28 kg/m   Visual Acuity Right Eye Distance:   Left Eye Distance:   Bilateral Distance:    Right Eye Near:   Left Eye Near:    Bilateral Near:     Physical Exam Vitals and nursing note reviewed.  Constitutional:      General: She is not in acute distress.    Appearance: Normal appearance. She is not ill-appearing.  HENT:     Head: Normocephalic and atraumatic.     Right Ear: Tympanic membrane, ear canal and external ear normal. There is no impacted cerumen.     Left Ear: Tympanic membrane, ear canal and external ear normal. There is no impacted cerumen.     Nose: Nose normal. No congestion or rhinorrhea.     Mouth/Throat:     Mouth: Mucous membranes are moist.     Pharynx: Oropharynx is clear. No posterior oropharyngeal erythema.  Cardiovascular:     Rate and Rhythm: Normal rate and regular rhythm.     Pulses: Normal pulses.     Heart sounds: Normal heart sounds. No murmur heard.   No gallop.  Pulmonary:     Effort: Pulmonary effort is normal.     Breath sounds: No wheezing, rhonchi or rales.  Musculoskeletal:     Cervical back: Normal range of motion and neck supple.  Lymphadenopathy:     Cervical: No  cervical adenopathy.  Skin:    General: Skin is warm and dry.     Capillary Refill: Capillary refill takes less than 2 seconds.     Findings: No erythema or rash.  Neurological:     General: No focal deficit present.     Mental Status: She is alert and oriented to person, place, and time.  Psychiatric:        Mood and Affect: Mood normal.        Behavior: Behavior normal.        Thought Content: Thought content normal.        Judgment: Judgment normal.     UC Treatments / Results  Labs (all labs ordered are listed, but only abnormal results are displayed) Labs Reviewed - No data to display  EKG   Radiology DG Chest 2 View  Result Date: 07/09/2021 CLINICAL DATA:  Acute shortness of breath and chest pain. EXAM: CHEST - 2 VIEW COMPARISON:  10/11/2020 and prior radiographs FINDINGS: The cardiomediastinal silhouette is unremarkable. There is paucity of lung markings in the LEFT lung apex, unchanged from 10/11/2020 which may represent a small residual LEFT apical pneumothorax. The moderate basilar LEFT pneumothorax identified on 10/11/2020 has resolved. No pleural effusion, pneumothorax, mass, airspace disease or new bony abnormality noted. Remote LEFT-sided rib fractures and LOWER thoracic compression fracture with vertebral augmentation changes are again identified. IMPRESSION: 1. Possible small residual LEFT apical pneumothorax that appears unchanged from 10/11/2020. Resolution of moderate LEFT basilar pneumothorax identified on 10/11/2020. Chest CT may be helpful for further evaluation as clinically indicated. 2. No other significant abnormalities noted. Electronically Signed   By: Margarette Canada M.D.   On: 07/09/2021 13:43    Procedures Procedures (including critical care time)  Medications Ordered in UC Medications - No data to display  Initial Impression / Assessment and Plan / UC Course  I have reviewed the triage vital signs and the nursing notes.  Pertinent labs & imaging  results that were available during my care of the patient were reviewed by me and considered in my medical decision making (see chart for details).  Patient is a nontoxic-appearing 60 year old female here for evaluation of shortness of breath and sharp left-sided "lung pain" that began 2 days ago.  Patient has a history of metastatic lung cancer and states that when she gets the symptoms she typically has pneumonia or a collapsed lung.  Patient was most recently evaluated on fibber 16th 2022 and found to have a left apical and left basilar pneumothorax.  On subsequent CT scan to monitor her metastatic lung cancer on 05/02/2021 the radiology report states that there is no pneumothorax seen.  Chest x-ray was obtained at triage which shows a left apical pneumothorax and resolution of the left basilar pneumothorax.  Patient's physical exam reveals a benign upper respiratory exam.  Cardiopulmonary exam reveals S1-S2 heart sounds.  Patient does have mildly decreased lung sounds in the left apex but the remainder of her lung sounds are clear to auscultation.  Patient does have some pain with deep inspiration on the left side.  I advised the patient that this is most likely a new pneumothorax and that she needs to be evaluated in the emergency department.  She is reluctant to do so as they did not place a chest tube last time she had a pneumothorax.  I have reached out to her pulmonologist, Dr. Raul Del over at [facility], through secure epic chat for his recommendations.  If I do not hear back from him shortly I am going to press the issue of sending the patient to the emergency department for evaluation of the pneumothorax.  1458: I have not heard back from Dr. Raul Del and  I discussed with the patient that this pneumothorax appears to be new because of the lack of mention on the September CT scan.  I have advised her that she needs to be evaluated in the emergency department and may need to be admitted to the  hospital and monitored to see if this pneumothorax gets bigger or is resolving.  I suggest the patient go to the hospital in an ambulance and she is reluctant to do so she is wanting to go via private vehicle.  Patient has had symptoms for 2 days and she is stable.  I think she is okay to go via POV.  Patient going to [facility] ER.   Final Clinical Impressions(s) / UC Diagnoses   Final diagnoses:  Spontaneous pneumothorax     Discharge Instructions      As we discussed, you have a pneumothorax in the upper part of her left lung which may be new.  You need to be evaluated in the emergency department and monitored for improvement or worsening of your pneumothorax.  I do believe you are safe to drive via POV but I believe it would be better for you to go by ambulance.  I understand your reluctance and wanting to go via private vehicle.     ED Prescriptions   None    PDMP not reviewed this encounter.   Margarette Canada, NP 07/09/21 1501

## 2021-07-09 NOTE — ED Triage Notes (Signed)
Pt here with C/O Shortness of breath, and sharp pain in left lung. Does have lung cancer. Denies cough.

## 2021-08-06 DIAGNOSIS — E876 Hypokalemia: Secondary | ICD-10-CM | POA: Insufficient documentation

## 2021-08-29 ENCOUNTER — Ambulatory Visit (INDEPENDENT_AMBULATORY_CARE_PROVIDER_SITE_OTHER): Payer: Medicare Other

## 2021-08-29 ENCOUNTER — Ambulatory Visit
Admission: EM | Admit: 2021-08-29 | Discharge: 2021-08-29 | Disposition: A | Discharge status: Home / Self Care | Payer: Medicare Other | Attending: Emergency Medicine | Admitting: Emergency Medicine

## 2021-08-29 ENCOUNTER — Other Ambulatory Visit: Payer: Self-pay

## 2021-08-29 ENCOUNTER — Encounter: Payer: Self-pay | Admitting: Emergency Medicine

## 2021-08-29 DIAGNOSIS — R051 Acute cough: Secondary | ICD-10-CM | POA: Diagnosis present

## 2021-08-29 DIAGNOSIS — Z9981 Dependence on supplemental oxygen: Secondary | ICD-10-CM | POA: Insufficient documentation

## 2021-08-29 DIAGNOSIS — R531 Weakness: Secondary | ICD-10-CM | POA: Diagnosis not present

## 2021-08-29 DIAGNOSIS — R079 Chest pain, unspecified: Secondary | ICD-10-CM | POA: Diagnosis not present

## 2021-08-29 DIAGNOSIS — R5383 Other fatigue: Secondary | ICD-10-CM

## 2021-08-29 DIAGNOSIS — J189 Pneumonia, unspecified organism: Secondary | ICD-10-CM | POA: Diagnosis not present

## 2021-08-29 DIAGNOSIS — R509 Fever, unspecified: Secondary | ICD-10-CM | POA: Diagnosis not present

## 2021-08-29 DIAGNOSIS — Z20822 Contact with and (suspected) exposure to covid-19: Secondary | ICD-10-CM | POA: Diagnosis not present

## 2021-08-29 DIAGNOSIS — Z85118 Personal history of other malignant neoplasm of bronchus and lung: Secondary | ICD-10-CM | POA: Insufficient documentation

## 2021-08-29 DIAGNOSIS — R0602 Shortness of breath: Secondary | ICD-10-CM | POA: Diagnosis present

## 2021-08-29 LAB — RESP PANEL BY RT-PCR (FLU A&B, COVID) ARPGX2
Influenza A by PCR: NEGATIVE
Influenza B by PCR: NEGATIVE
SARS Coronavirus 2 by RT PCR: NEGATIVE

## 2021-08-29 MED ORDER — ALBUTEROL SULFATE HFA 108 (90 BASE) MCG/ACT IN AERS: 2.0000 | INHALATION_SPRAY | RESPIRATORY_TRACT | 0 refills | Status: AC | PRN

## 2021-08-29 MED ORDER — BENZONATATE 100 MG PO CAPS: 200.0000 mg | ORAL_CAPSULE | Freq: Three times a day (TID) | ORAL | 0 refills | Status: DC

## 2021-08-29 MED ORDER — AMOXICILLIN-POT CLAVULANATE 875-125 MG PO TABS: 1.0000 | ORAL_TABLET | Freq: Two times a day (BID) | ORAL | 0 refills | Status: AC

## 2021-08-29 MED ORDER — AEROCHAMBER MV MISC: 2 refills | Status: AC

## 2021-08-29 MED ORDER — PROMETHAZINE-DM 6.25-15 MG/5ML PO SYRP: 5.0000 mL | ORAL_SOLUTION | Freq: Four times a day (QID) | ORAL | 0 refills | Status: DC | PRN

## 2021-08-29 MED ORDER — PREDNISONE 20 MG PO TABS: 60.0000 mg | ORAL_TABLET | Freq: Every day | ORAL | 0 refills | Status: AC

## 2021-08-29 MED ORDER — AZITHROMYCIN 250 MG PO TABS: 250.0000 mg | ORAL_TABLET | Freq: Every day | ORAL | 0 refills | Status: DC

## 2021-08-29 NOTE — ED Provider Notes (Signed)
[facility]    CSN: [id number] Arrival date & time: 08/29/21  0813      History   Chief Complaint Chief Complaint  Patient presents with   Cough    HPI Nichole Zhang is a 61 y.o. female.   HPI  61 year old female here for evaluation of respiratory complaints.  Patient has a history of lung cancer and is on 4 L O2 via nasal cannula at all times.  She reports that last 2 days she has had an increase in her shortness of breath, cough that is productive for brown sputum, nausea, subjective fever with sweating, mild runny nose nasal congestion, sore throat, fatigue, and a feeling of weakness.  She denies any ear pain, wheezing, vomiting, or diarrhea.  Past Medical History:  Diagnosis Date   Cancer ([place])    stage IV lung    There are no problems to display for this patient.   Past Surgical History:  Procedure Laterality Date   BACK SURGERY     T12   CHOLECYSTECTOMY     GALLBLADDER SURGERY      OB History   No obstetric history on file.      Home Medications    Prior to Admission medications   Medication Sig Start Date End Date Taking? Authorizing Provider  albuterol (VENTOLIN HFA) 108 (90 Base) MCG/ACT inhaler Inhale 2 puffs into the lungs every 4 (four) hours as needed. 08/29/21  Yes Margarette Canada, NP  amoxicillin-clavulanate (AUGMENTIN) 875-125 MG tablet Take 1 tablet by mouth every 12 (twelve) hours for 10 days. 08/29/21 09/08/21 Yes Margarette Canada, NP  azithromycin (ZITHROMAX Z-PAK) 250 MG tablet Take 1 tablet (250 mg total) by mouth daily. Take 2 tablets on the first day and then 1 tablet daily thereafter for a total of 5 days of treatment. 08/29/21  Yes Margarette Canada, NP  benzonatate (TESSALON) 100 MG capsule Take 2 capsules (200 mg total) by mouth every 8 (eight) hours. 08/29/21  Yes Margarette Canada, NP  predniSONE (DELTASONE) 20 MG tablet Take 3 tablets (60 mg total) by mouth daily with breakfast for 5 days. 3 tablets daily for 5 days. 08/29/21 09/03/21 Yes  Margarette Canada, NP  promethazine-dextromethorphan (PROMETHAZINE-DM) 6.25-15 MG/5ML syrup Take 5 mLs by mouth 4 (four) times daily as needed. 08/29/21  Yes Margarette Canada, NP  Spacer/Aero-Holding Chambers (AEROCHAMBER MV) inhaler Use as instructed 08/29/21  Yes Margarette Canada, NP  alendronate (FOSAMAX) 70 MG tablet Take 70 mg by mouth once a week. 08/25/20   [provider]  ANORO ELLIPTA 62.5-25 MCG/INH AEPB Inhale 1 puff into the lungs daily. 09/10/20   [provider]  butalbital-acetaminophen-caffeine (FIORICET) 50-325-40 MG tablet Take 1 tablet by mouth every 6 (six) hours as needed. 01/18/21   [provider]  Calcium Carbonate-Vitamin D 600-400 MG-UNIT tablet Take 1 tablet by mouth daily.    [provider]  dexamethasone (DECADRON) 2 MG tablet Take by mouth. 01/26/21   [provider]  fluticasone Asencion Islam) 50 MCG/ACT nasal spray SMARTSIG:1-2 Spray(s) Both Nares Daily PRN 08/31/20   [provider]  gabapentin (NEURONTIN) 300 MG capsule SMARTSIG:1-2 Capsule(s) By Mouth Every Evening 08/31/20   [provider]  HYDROmorphone (DILAUDID) 4 MG tablet Take 4 mg by mouth every 4 (four) hours as needed. 08/25/20   [provider]  losartan-hydrochlorothiazide (HYZAAR) 100-25 MG tablet Take 1 tablet by mouth daily. 03/13/21   [provider]  OLANZapine (ZYPREXA) 2.5 MG tablet Take 2.5 mg by mouth at  bedtime. 08/27/20   [provider]  OLANZapine (ZYPREXA) 5 MG tablet Take by mouth. 10/02/18 12/31/18  [provider]  ondansetron (ZOFRAN ODT) 4 MG disintegrating tablet Take 1 tablet (4 mg total) by mouth every 8 (eight) hours as needed for nausea or vomiting. 04/08/21   Coral Spikes, DO  pantoprazole (PROTONIX) 40 MG tablet Take by mouth. 01/18/21 01/18/22  [provider]  simvastatin (ZOCOR) 10 MG tablet Take by mouth. 03/12/21   [provider]  venlafaxine XR (EFFEXOR-XR) 75 MG 24 hr capsule Take 75 mg by  mouth daily. 09/17/20   [provider]  Vitamin D, Ergocalciferol, (DRISDOL) 1.25 MG (50000 UT) CAPS capsule Take 1 capsule by mouth weekly for 12 weeks 10/12/18   [provider]  cloNIDine (CATAPRES) 0.1 MG tablet Take by mouth. 11/09/18 10/11/20  [provider]    Family History Family History  Problem Relation Age of Onset   Heart attack Mother    Hypertension Mother    Hypertension Father    Hyperlipidemia Father    Breast cancer Neg Hx     Social History Social History   Tobacco Use   Smoking status: Never   Smokeless tobacco: Never  Vaping Use   Vaping Use: Never used  Substance Use Topics   Alcohol use: No   Drug use: No     Allergies   Patient has no known allergies.   Review of Systems Review of Systems  Constitutional:  Positive for diaphoresis, fatigue and fever. Negative for activity change and appetite change.  HENT:  Positive for congestion, rhinorrhea and sore throat. Negative for ear pain.   Respiratory:  Positive for cough and shortness of breath. Negative for wheezing.   Gastrointestinal:  Positive for nausea. Negative for diarrhea and vomiting.  Skin:  Negative for rash.  Hematological: Negative.   Psychiatric/Behavioral: Negative.      Physical Exam Triage Vital Signs ED Triage Vitals  Enc Vitals Group     BP 08/29/21 0831 105/74     Pulse Rate 08/29/21 0829 (!) 129     Resp 08/29/21 0829 (!) 22     Temp 08/29/21 0829 98.7 F (37.1 C)     Temp Source 08/29/21 0829 Oral     SpO2 08/29/21 0829 99 %     Weight --      Height --      Head Circumference --      Peak Flow --      Pain Score 08/29/21 0830 5     Pain Loc --      Pain Edu? --      Excl. in [place]? --    No data found.  Updated Vital Signs BP 105/74    Pulse (!) 129    Temp 98.7 F (37.1 C) (Oral)    Resp (!) 22    SpO2 99%   Visual Acuity Right Eye Distance:   Left Eye Distance:   Bilateral Distance:    Right Eye Near:   Left Eye Near:     Bilateral Near:     Physical Exam Vitals and nursing note reviewed.  Constitutional:      Appearance: She is ill-appearing.  HENT:     Head: Normocephalic and atraumatic.     Right Ear: Tympanic membrane, ear canal and external ear normal. There is no impacted cerumen.     Left Ear: Tympanic membrane, ear canal and external ear normal. There is no impacted cerumen.  Nose: Congestion and rhinorrhea present.     Mouth/Throat:     Mouth: Mucous membranes are moist.     Pharynx: Oropharynx is clear. No posterior oropharyngeal erythema.  Cardiovascular:     Rate and Rhythm: Normal rate and regular rhythm.     Pulses: Normal pulses.     Heart sounds: Normal heart sounds. No murmur heard.   No friction rub. No gallop.  Pulmonary:     Effort: Pulmonary effort is normal.     Breath sounds: Normal breath sounds. No wheezing, rhonchi or rales.  Musculoskeletal:     Cervical back: Normal range of motion and neck supple.  Lymphadenopathy:     Cervical: No cervical adenopathy.  Skin:    General: Skin is warm and dry.     Capillary Refill: Capillary refill takes less than 2 seconds.     Findings: No erythema or rash.  Neurological:     General: No focal deficit present.     Mental Status: She is alert and oriented to person, place, and time.  Psychiatric:        Mood and Affect: Mood normal.        Behavior: Behavior normal.        Thought Content: Thought content normal.        Judgment: Judgment normal.     UC Treatments / Results  Labs (all labs ordered are listed, but only abnormal results are displayed) Labs Reviewed  RESP PANEL BY RT-PCR (FLU A&B, COVID) [id number]    EKG   Radiology DG Chest 2 View  Result Date: 08/29/2021 CLINICAL DATA:  Cough, fatigue, weakness, and fever. History of lung cancer. EXAM: CHEST - 2 VIEW COMPARISON:  07/09/2021 FINDINGS: The cardiomediastinal silhouette is unchanged with normal heart size. There is a chronic paucity of lung markings in the  left lung apex which is similar to a remote study from 08/14/2020, and no definite pleural line is visible today to clearly indicate the presence of a pneumothorax. Scarring is again noted in the left mid lung. There is new patchy airspace opacity in the right lung base. No pleural effusion is evident. Old thoracic compression fractures and old left-sided rib fractures are noted. IMPRESSION: New right basilar airspace opacity which may reflect pneumonia. Electronically Signed   By: Logan Bores M.D.   On: 08/29/2021 10:01    Procedures Procedures (including critical care time)  Medications Ordered in UC Medications - No data to display  Initial Impression / Assessment and Plan / UC Course  I have reviewed the triage vital signs and the nursing notes.  Pertinent labs & imaging results that were available during my care of the patient were reviewed by me and considered in my medical decision making (see chart for details).  Patient is a pleasant though ill-appearing 61 year old female with a history of lung cancer requiring 4 L O2 via nasal cannula at all times presenting for evaluation of worsening respiratory symptoms.  No triage reports fever for the last 3 to 4 days and patient reports that her cough, shortness of breath, fatigue, weakness have been worsening over the last 2 days.  Her fever has been subjective but she has not measured at home but she states that she feels hot and breaks out in a sweat.  She said some mild runny nose and nasal congestion, sore throat, increase in shortness of breath, cough that is productive for brown sputum, and nausea.  Her physical exam reveals pearly gray tympanic membranes bilaterally  with normal light reflex and clear external auditory canals.  Nasal mucosa is mildly erythematous and there is dried blood in both nares.  No significant edema noted.  Oropharyngeal exam is benign.  No cervical lymphadenopathy appreciated exam.  Cardiopulmonary exam reveals rhonchi  when listening to the mid chest anteriorly and decreased lung sounds in all lung fields from listening to the breath sounds posteriorly.  Patient has normal chest excursion and she is able to speak in full sentences without dyspnea or tachypnea.  She is tachycardic at 129.  Respiratory triplex panel was collected at triage is negative for COVID and influenza.  Will obtain chest x-ray to look for the presence of pneumonia.  Chest x-ray independently reviewed and evaluated by me.  Impression: There is a questionable opacity in the right lower lobe.  The remainder the lung fields are well pneumatized.  Does appear to be a healed rib fracture on the left at the level of the second rib.  No hemothorax or pneumothorax.  Radiology overread is pending. Radiology impression states chronic paucity of lung markings in the left lung apex which is similar to her remote study from 08/14/2020 and no definite pleural line is visible day to clearly indicate the presence of pneumothorax.  Scarring is again noted in the left midlung.  There is new patchy airspace opacity in the right lung base.  No pleural effusion is evident.  Old thoracic compression fractures and old left-sided rib fractures are noted.  Impression is new right basilar airspace opacity which may reflect pneumonia.  We will treat patient for community-acquired pneumonia with dual therapy of Augmentin and azithromycin.  We will also give prednisone to help with fatigue and pulmonary inflammation.  Patient is on an ultra long-acting beta agonist with an oral Ellipta.  We will also prescribe albuterol with spacer as needed for shortness of breath and wheezing.  We will also give Tessalon Perles and Promethazine DM cough syrup as needed for cough and congestion.  Final Clinical Impressions(s) / UC Diagnoses   Final diagnoses:  Community acquired pneumonia of right lower lobe of lung     Discharge Instructions      Take the Augmentin twice daily with food  for 10 days for treatment of your pneumonia.  Take the azithromycin as directed.  You will take 2 tablets on day 1 and then 1 tablet each day after that for total of 5 days for treatment of your pneumonia.  Use the albuterol inhaler with a spacer, 2 puffs every 4-6 hours, as needed for shortness of breath or wheezing.  Use the Tessalon Perles every 8 hours during the day as needed for cough.  Taken with a small sip of water.  These may give you numbness to the base of your tongue or metallic taste in mouth, this is normal.  Use the Promethazine DM cough syrup at bedtime for cough and congestion as it would make you drowsy.  Return for reevaluation if you have any new or worsening symptoms.  Follow-up with your primary care provider in 4 to 6 weeks for a repeat chest x-ray to ensure resolution of your pneumonia.      ED Prescriptions     Medication Sig Dispense Auth. Provider   amoxicillin-clavulanate (AUGMENTIN) 875-125 MG tablet Take 1 tablet by mouth every 12 (twelve) hours for 10 days. 20 tablet Margarette Canada, NP   azithromycin (ZITHROMAX Z-PAK) 250 MG tablet Take 1 tablet (250 mg total) by mouth daily. Take 2 tablets on  the first day and then 1 tablet daily thereafter for a total of 5 days of treatment. 6 tablet Margarette Canada, NP   albuterol (VENTOLIN HFA) 108 (90 Base) MCG/ACT inhaler Inhale 2 puffs into the lungs every 4 (four) hours as needed. 18 g Margarette Canada, NP   Spacer/Aero-Holding Chambers (AEROCHAMBER MV) inhaler Use as instructed 1 each Margarette Canada, NP   predniSONE (DELTASONE) 20 MG tablet Take 3 tablets (60 mg total) by mouth daily with breakfast for 5 days. 3 tablets daily for 5 days. 15 tablet Margarette Canada, NP   benzonatate (TESSALON) 100 MG capsule Take 2 capsules (200 mg total) by mouth every 8 (eight) hours. 21 capsule Margarette Canada, NP   promethazine-dextromethorphan (PROMETHAZINE-DM) 6.25-15 MG/5ML syrup Take 5 mLs by mouth 4 (four) times daily as needed. 118 mL Margarette Canada, NP      PDMP not reviewed this encounter.   Margarette Canada, NP 08/29/21 1012

## 2021-08-29 NOTE — ED Triage Notes (Signed)
PT reports cough, fatigue, weakness, fever for 3-4 days. Is on 4L O2 at all times. Does have lung cancer.

## 2021-08-29 NOTE — Discharge Instructions (Signed)
Take the Augmentin twice daily with food for 10 days for treatment of your pneumonia.  Take the azithromycin as directed.  You will take 2 tablets on day 1 and then 1 tablet each day after that for total of 5 days for treatment of your pneumonia.  Use the albuterol inhaler with a spacer, 2 puffs every 4-6 hours, as needed for shortness of breath or wheezing.  Use the Tessalon Perles every 8 hours during the day as needed for cough.  Taken with a small sip of water.  These may give you numbness to the base of your tongue or metallic taste in mouth, this is normal.  Use the Promethazine DM cough syrup at bedtime for cough and congestion as it would make you drowsy.  Return for reevaluation if you have any new or worsening symptoms.  Follow-up with your primary care provider in 4 to 6 weeks for a repeat chest x-ray to ensure resolution of your pneumonia.

## 2021-09-13 DIAGNOSIS — R232 Flushing: Secondary | ICD-10-CM | POA: Insufficient documentation

## 2021-09-19 ENCOUNTER — Other Ambulatory Visit: Payer: Self-pay | Admitting: Gerontology

## 2021-09-19 DIAGNOSIS — R112 Nausea with vomiting, unspecified: Secondary | ICD-10-CM

## 2021-09-19 DIAGNOSIS — R1031 Right lower quadrant pain: Secondary | ICD-10-CM

## 2021-09-19 DIAGNOSIS — N200 Calculus of kidney: Secondary | ICD-10-CM

## 2021-09-27 ENCOUNTER — Encounter
Admission: RE | Disposition: A | Discharge status: Home / Self Care | Payer: Self-pay | Source: Home / Self Care | Attending: Urology

## 2021-09-27 ENCOUNTER — Encounter: Payer: Self-pay | Admitting: Urology

## 2021-09-27 ENCOUNTER — Ambulatory Visit: Admission: RE | Admit: 2021-09-27 | Payer: Medicare Other | Source: Ambulatory Visit

## 2021-09-27 ENCOUNTER — Ambulatory Visit
Admission: RE | Admit: 2021-09-27 | Discharge: 2021-09-27 | Disposition: A | Discharge status: Home / Self Care | Payer: Medicare Other | Attending: Urology | Admitting: Urology

## 2021-09-27 ENCOUNTER — Other Ambulatory Visit: Payer: Self-pay

## 2021-09-27 DIAGNOSIS — C3490 Malignant neoplasm of unspecified part of unspecified bronchus or lung: Secondary | ICD-10-CM | POA: Insufficient documentation

## 2021-09-27 DIAGNOSIS — Z6834 Body mass index (BMI) 34.0-34.9, adult: Secondary | ICD-10-CM | POA: Diagnosis not present

## 2021-09-27 DIAGNOSIS — J449 Chronic obstructive pulmonary disease, unspecified: Secondary | ICD-10-CM | POA: Diagnosis not present

## 2021-09-27 DIAGNOSIS — C7931 Secondary malignant neoplasm of brain: Secondary | ICD-10-CM | POA: Diagnosis not present

## 2021-09-27 DIAGNOSIS — Z9981 Dependence on supplemental oxygen: Secondary | ICD-10-CM | POA: Insufficient documentation

## 2021-09-27 DIAGNOSIS — I10 Essential (primary) hypertension: Secondary | ICD-10-CM | POA: Diagnosis not present

## 2021-09-27 DIAGNOSIS — E669 Obesity, unspecified: Secondary | ICD-10-CM | POA: Diagnosis not present

## 2021-09-27 DIAGNOSIS — Z87891 Personal history of nicotine dependence: Secondary | ICD-10-CM | POA: Insufficient documentation

## 2021-09-27 DIAGNOSIS — N200 Calculus of kidney: Secondary | ICD-10-CM | POA: Diagnosis present

## 2021-09-27 HISTORY — PX: EXTRACORPOREAL SHOCK WAVE LITHOTRIPSY: SHX1557

## 2021-09-27 SURGERY — LITHOTRIPSY, ESWL
Anesthesia: Moderate Sedation | Laterality: Right

## 2021-09-27 MED ORDER — LEVOFLOXACIN 500 MG PO TABS
ORAL_TABLET | ORAL | Status: AC
  Administered 2021-09-27: 500 mg
  Filled 2021-09-27: qty 1

## 2021-09-27 MED ORDER — FUROSEMIDE 10 MG/ML IJ SOLN
INTRAMUSCULAR | Status: AC
  Filled 2021-09-27: qty 2

## 2021-09-27 MED ORDER — FUROSEMIDE 10 MG/ML IJ SOLN
10.0000 mg | Freq: Once | INTRAMUSCULAR | Status: AC
  Administered 2021-09-27: 10 mg via INTRAVENOUS

## 2021-09-27 MED ORDER — MORPHINE SULFATE (PF) 10 MG/ML IV SOLN
INTRAVENOUS | Status: AC
  Administered 2021-09-27: 10 mg
  Filled 2021-09-27: qty 1

## 2021-09-27 MED ORDER — MIDAZOLAM HCL 2 MG/2ML IJ SOLN
INTRAMUSCULAR | Status: AC
  Administered 2021-09-27: 1 mg
  Filled 2021-09-27: qty 2

## 2021-09-27 MED ORDER — DIPHENHYDRAMINE HCL 25 MG PO CAPS
ORAL_CAPSULE | ORAL | Status: AC
  Administered 2021-09-27: 25 mg
  Filled 2021-09-27: qty 1

## 2021-09-27 MED ORDER — PROMETHAZINE HCL 25 MG/ML IJ SOLN
INTRAMUSCULAR | Status: AC
  Administered 2021-09-27: 25 mg
  Filled 2021-09-27: qty 1

## 2021-09-27 MED ORDER — SULFAMETHOXAZOLE-TRIMETHOPRIM 800-160 MG PO TABS: 1.0000 | ORAL_TABLET | Freq: Two times a day (BID) | ORAL | 0 refills | Status: DC

## 2021-09-27 NOTE — OR Nursing (Signed)
Spoke with Margarita Grizzle on the [employer] truck. Dr. Baruch Merl has evaluated patient and rates her as an ASA III. Patient without distress at this time. Transporting with 4 L of O2.

## 2021-09-27 NOTE — Discharge Instructions (Addendum)
Lithotripsy, Care After This sheet gives you information about how to care for yourself after your procedure. Your health care provider may also give you more specific instructions. If you have problems or questions, contact your health care provider. What can I expect after the procedure? After the procedure, it is common to have: Some blood in your urine. This should only last for a few days. Soreness in your back, sides, or upper abdomen for a few days. Blotches or bruises on the area where the shock wave entered the skin. Pain, discomfort, or nausea when pieces (fragments) of the kidney stone move through the tube that carries urine from the kidney to the bladder (ureter). Stone fragments may pass soon after the procedure, but they may continue to pass for up to 4-8 weeks. If you have severe pain or nausea, contact your health care provider. This may be caused by a large stone that was not broken up, and this may mean that you need more treatment. Some pain or discomfort during urination. Some pain or discomfort in the lower abdomen or (in men) at the base of the penis. Follow these instructions at home: Medicines Take over-the-counter and prescription medicines only as told by your health care provider. If you were prescribed an antibiotic medicine, take it as told by your health care provider. Do not stop taking the antibiotic even if you start to feel better. Ask your health care provider if the medicine prescribed to you requires you to avoid driving or using machinery. Eating and drinking A comparison of three sample cups showing dark yellow, yellow, and pale yellow urine.    A plate with examples of foods in a healthy diet.  Drink enough fluid to keep your urine pale yellow. This helps any remaining pieces of the stone to pass. It can also help prevent new stones from forming. Eat plenty of fresh fruits and vegetables. Follow instructions from your health care provider about eating  or drinking restrictions. You may be instructed to: Reduce how much salt (sodium) you eat or drink. Check ingredients and nutrition facts on packaged foods and beverages to see how much sodium they contain. Reduce how much meat you eat. Eat the recommended amount of calcium for your age and gender. Ask your health care provider how much calcium you should have. General instructions Get plenty of rest. Return to your normal activities as told by your health care provider. Ask your health care provider what activities are safe for you. Most people can resume normal activities 1-2 days after the procedure. If you were given a sedative during the procedure, it can affect you for several hours. Do not drive or operate machinery until your health care provider says that it is safe. Your health care provider may direct you to lie in a certain position (postural drainage) and tap firmly (percuss) over your kidney area to help stone fragments pass. Follow instructions as told by your health care provider. If directed, strain all urine through the strainer that was provided by your health care provider. Keep all fragments for your health care provider to see. Any stones that are found may be sent to a medical lab for examination. The stone may be as small as a grain of salt. Keep all follow-up visits as told by your health care provider. This is important. Contact a health care provider if: You have a fever or chills. You have nausea that is severe or does not go away. You have any of these  urinary symptoms: Blood in your urine for longer than your health care provider told you to expect. Urine that smells bad or unusual. Feeling a strong urge to urinate after emptying your bladder. Pain or burning with urination that does not go away. Urinating more often than usual and this does not go away. You have a stent and it comes out. Get help right away if: You have severe pain in your back, sides, or upper  abdomen. You have any of these urinary symptoms: Severe pain while urinating. More blood in your urine or having blood in your urine when you did not before. Passing blood clots in your urine. Passing only a small amount of urine or being unable to pass any urine at all. You have severe nausea that leads to persistent vomiting. You faint. Summary After this procedure, it is common to have some pain, discomfort, or nausea when pieces (fragments) of the kidney stone move through the tube that carries urine from the kidney to the bladder (ureter). If this pain or nausea is severe, however, you should contact your health care provider. Return to your normal activities as told by your health care provider. Ask your health care provider what activities are safe for you. Drink enough fluid to keep your urine pale yellow. This helps any remaining pieces of the stone to pass, and it can help prevent new stones from forming. If directed, strain your urine and keep all fragments for your health care provider to see. Fragments or stones may be as small as a grain of salt. Get help right away if you have severe pain in your back, sides, or upper abdomen, or if you have severe pain while urinating. This information is not intended to replace advice given to you by your health care provider. Make sure you discuss any questions you have with your health care provider. Document Revised: 04/16/2021 Document Reviewed: 04/16/2021 Elsevier Patient Education  2022 [place].     Kidney Stones Kidney stones are solid, rock-like deposits that form inside of the kidneys. The kidneys are a pair of organs that make urine. A kidney stone may form in a kidney and move into other parts of the urinary tract, including the tubes that connect the kidneys to the bladder (ureters), the bladder, and the tube that carries urine out of the body (urethra). As the stone moves through these areas, it can cause intense pain and block  the flow of urine. Kidney stones are created when high levels of certain minerals are found in the urine. The stones are usually passed out of the body through urination, but in some cases, medical treatment may be needed to remove them. What are the causes? Kidney stones may be caused by: A condition in which certain glands produce too much parathyroid hormone (primary hyperparathyroidism), which causes too much calcium buildup in the blood. A buildup of uric acid crystals in the bladder (hyperuricosuria). Uric acid is a chemical that the body produces when you eat certain foods. It usually exits the body in the urine. Narrowing (stricture) of one or both of the ureters. A kidney blockage that is present at birth (congenital obstruction). Past surgery on the kidney or the ureters, such as gastric bypass surgery. What increases the risk? The following factors may make you more likely to develop this condition: Having had a kidney stone in the past. Having a family history of kidney stones. Not drinking enough water. Eating a diet that is high in protein, salt (  sodium), or sugar. Being overweight or obese. What are the signs or symptoms? Symptoms of a kidney stone may include: Pain in the side of the abdomen, right below the ribs (flank pain). Pain usually spreads (radiates) to the groin. Needing to urinate frequently or urgently. Painful urination. Blood in the urine (hematuria). Nausea. Vomiting. Fever and chills. How is this diagnosed? This condition may be diagnosed based on: Your symptoms and medical history. A physical exam. Blood tests. Urine tests. These may be done before and after the stone passes out of your body through urination. Imaging tests, such as a CT scan, abdominal X-ray, or ultrasound. A procedure to examine the inside of the bladder (cystoscopy). How is this treated? Treatment for kidney stones depends on the size, location, and makeup of the stones. Kidney  stones will often pass out of the body through urination. You may need to: Increase your fluid intake to help pass the stone. In some cases, you may be given fluids through an IV and may need to be monitored at the hospital. Take medicine for pain. Make changes in your diet to help prevent kidney stones from coming back. Sometimes, medical procedures are needed to remove a kidney stone. This may involve: A procedure to break up kidney stones using: A focused beam of light (laser therapy). Shock waves (extracorporeal shock wave lithotripsy). Surgery to remove kidney stones. This may be needed if you have severe pain or have stones that block your urinary tract. Follow these instructions at home: Medicines Take over-the-counter and prescription medicines only as told by your health care provider. Ask your health care provider if the medicine prescribed to you requires you to avoid driving or using heavy machinery. Eating and drinking Drink enough fluid to keep your urine pale yellow. You may be instructed to drink at least 8-10 glasses of water each day. This will help you pass the kidney stone. If directed, change your diet. This may include: Limiting how much sodium you eat. Eating more fruits and vegetables. Limiting how much animal protein--such as red meat, poultry, fish, and eggs--you eat. Follow instructions from your health care provider about eating or drinking restrictions. General instructions Collect urine samples as told by your health care provider. You may need to collect a urine sample: 24 hours after you pass the stone. 8-12 weeks after passing the kidney stone, and every 6-12 months after that. Strain your urine every time you urinate, for as long as directed. Use the strainer that your health care provider recommends. Do not throw out the kidney stone after passing it. Keep the stone so it can be tested by your health care provider. Testing the makeup of your kidney stone  may help prevent you from getting kidney stones in the future. Keep all follow-up visits as told by your health care provider. This is important. You may need follow-up X-rays or ultrasounds to make sure that your stone has passed. How is this prevented? To prevent another kidney stone: Drink enough fluid to keep your urine pale yellow. This is the best way to prevent kidney stones. Eat a healthy diet and follow recommendations from your health care provider about foods to avoid. You may be instructed to eat a low-protein diet. Recommendations vary depending on the type of kidney stone that you have. Maintain a healthy weight. Where to find more information [place] [facility]): www.kidney.[facility]): www.urologyhealth.org Contact a health care provider if: You have pain that gets worse or does  not get better with medicine. Get help right away if: You have a fever or chills. You develop severe pain. You develop new abdominal pain. You faint. You are unable to urinate. Summary Kidney stones are solid, rock-like deposits that form inside of the kidneys. Kidney stones can cause nausea, vomiting, blood in the urine, abdominal pain, and the urge to urinate frequently. Treatment for kidney stones depends on the size, location, and makeup of the stones. Kidney stones will often pass out of the body through urination. Kidney stones can be prevented by drinking enough fluids, eating a healthy diet, and maintaining a healthy weight. This information is not intended to replace advice given to you by your health care provider. Make sure you discuss any questions you have with your health care provider. Document Revised: 12/25/2018 Document Reviewed: 12/29/2018 Elsevier Patient Education  2022 [place]   The drugs that you were given will stay in your system until tomorrow so for the next 24 hours you should  not:  Drive an automobile Make any legal decisions Drink any alcoholic beverage   You may resume regular meals tomorrow.  Today it is better to start with liquids and gradually work up to solid foods.  You may eat anything you prefer, but it is better to start with liquids, then soup and crackers, and gradually work up to solid foods.   Please notify your doctor immediately if you have any unusual bleeding, trouble breathing, redness and pain at the surgery site, drainage, fever, or pain not relieved by medication.    Additional Instructions:   Please contact your physician with any problems or Same Day Surgery at [phone], Monday through Friday 6 am to 4 pm, or [place] at [facility] number at [phone].

## 2021-09-28 ENCOUNTER — Encounter: Payer: Self-pay | Admitting: Urology

## 2021-12-10 ENCOUNTER — Ambulatory Visit
Admission: RE | Admit: 2021-12-10 | Discharge: 2021-12-10 | Disposition: A | Discharge status: Home / Self Care | Payer: Medicare Other | Source: Ambulatory Visit

## 2021-12-10 ENCOUNTER — Ambulatory Visit (INDEPENDENT_AMBULATORY_CARE_PROVIDER_SITE_OTHER): Payer: Medicare Other

## 2021-12-10 VITALS — BP 103/66 | HR 94 | Temp 98.1°F | Resp 20 | Ht 63.0 in | Wt 190.0 lb

## 2021-12-10 DIAGNOSIS — J189 Pneumonia, unspecified organism: Secondary | ICD-10-CM | POA: Diagnosis not present

## 2021-12-10 DIAGNOSIS — J441 Chronic obstructive pulmonary disease with (acute) exacerbation: Secondary | ICD-10-CM | POA: Diagnosis not present

## 2021-12-10 DIAGNOSIS — R059 Cough, unspecified: Secondary | ICD-10-CM | POA: Diagnosis not present

## 2021-12-10 DIAGNOSIS — G8929 Other chronic pain: Secondary | ICD-10-CM | POA: Insufficient documentation

## 2021-12-10 LAB — GROUP A STREP BY PCR: Group A Strep by PCR: NOT DETECTED

## 2021-12-10 MED ORDER — DOXYCYCLINE HYCLATE 100 MG PO CAPS: 100.0000 mg | ORAL_CAPSULE | Freq: Two times a day (BID) | ORAL | 0 refills | Status: DC

## 2021-12-10 MED ORDER — AMOXICILLIN-POT CLAVULANATE 875-125 MG PO TABS
1.0000 | ORAL_TABLET | Freq: Two times a day (BID) | ORAL | 0 refills | Status: DC
Start: 2021-12-10 — End: 2022-01-07

## 2021-12-10 MED ORDER — BENZONATATE 100 MG PO CAPS: 200.0000 mg | ORAL_CAPSULE | Freq: Three times a day (TID) | ORAL | 0 refills | Status: DC | PRN

## 2021-12-10 NOTE — Discharge Instructions (Signed)
Your x-ray showed that either your lungs are not completely inflated or that you are developing pneumonia.  I would like you to work on taking big deep breaths.  We are going to cover you for pneumonia.  Start Augmentin twice daily for 7 days.  You will also need to take doxycycline 100 mg twice daily for 7 days.  Stay out of the sun while on this medication.  Take both medications with food to prevent GI upset.  Continue with over-the-counter medications including Mucinex and Flonase.  Follow-up with your primary care provider later this week.  If you have any worsening symptoms including high fever, worsening cough, nausea/vomiting, increased oxygen need you need to go to the emergency room immediately. ?

## 2021-12-10 NOTE — ED Provider Notes (Signed)
?[facility] ? ? ? ?CSN: [id number] ?Arrival date & time: 12/10/21  1121 ? ? ?  ? ?History   ?Chief Complaint ?Chief Complaint  ?Patient presents with  ? Sore Throat  ?  Headache, sore throat, need an x-ray my left lung. It has cancer in it don't know whether I'm trying to get your pneumonia or what's going on. Pain in the left lung. - Entered by patient ?Appointment  ? ? ?HPI ?Nichole Zhang is a 61 y.o. female.  ? ?Presents today with a 3 to 4-day history of worsening cough.  She reports associated nausea and sore throat.  She has had a fever with last known temperature 100.7 degrees yesterday.  She has been using over-the-counter medications including NyQuil with temporary improvement of symptoms.  She had several doses of Augmentin leftover from a previous prescription which she took and believes this helped with her symptoms.  She took an at-home COVID test that was negative.  She is confident that this is not the flu or COVID and declines testing today.  She has had COVID several years ago.  She has a history of COPD and is on supplemental oxygen.  Denies any increased oxygen requirement.  She denies additional antibiotic use in the past several months.  She has a history of lung cancer on the left and is prone to recurrent pneumonia and so is concerned that might be causing current symptoms.  She is interested in chest x-ray. ? ? ?Past Medical History:  ?Diagnosis Date  ? Cancer [facility])   ? stage IV lung  ? ? ?Patient Active Problem List  ? Diagnosis Date Noted  ? Chronic midline thoracic back pain 12/10/2021  ? Hot flashes 09/13/2021  ? Hypokalemia 08/06/2021  ? Chronic, continuous use of opioids 06/15/2021  ? Claustrophobia 06/15/2021  ? Requires continuous at home supplemental oxygen 06/15/2021  ? Adnexal mass 04/24/2021  ? Essential hypertension 03/18/2021  ? Hypercholesterolemia 03/18/2021  ? Situational depression 03/18/2021  ? Chronic nausea 07/25/2019  ? Chronic inflammatory arthritis  06/15/2019  ? COPD (chronic obstructive pulmonary disease) ([place]) 12/08/2018  ? Drug-induced osteoporosis 11/26/2018  ? History of diverticulitis 06/15/2018  ? Pes anserine bursitis 04/02/2018  ? Peripheral edema 05/15/2017  ? Autoimmune hepatitis ([place]) 04/27/2017  ? Transaminitis 04/27/2017  ? Convalescence after chemotherapy 04/24/2017  ? Metastatic primary lung cancer, left ([place]) 03/20/2017  ? Lung mass 03/13/2017  ? Malignant neoplasm metastatic to brain [facility]) 03/13/2017  ? ? ?Past Surgical History:  ?Procedure Laterality Date  ? BACK SURGERY    ? T12  ? CHOLECYSTECTOMY    ? EXTRACORPOREAL SHOCK WAVE LITHOTRIPSY Right 09/27/2021  ? Procedure: EXTRACORPOREAL SHOCK WAVE LITHOTRIPSY (ESWL);  Surgeon: Royston Cowper, MD;  Location: [facility] ORS;  Service: Urology;  Laterality: Right;  ? GALLBLADDER SURGERY    ? ? ?OB History   ?No obstetric history on file. ?  ? ? ? ?Home Medications   ? ?Prior to Admission medications   ?Medication Sig Start Date End Date Taking? Authorizing Provider  ?amoxicillin-clavulanate (AUGMENTIN) 875-125 MG tablet Take 1 tablet by mouth every 12 (twelve) hours. 12/10/21  Yes Virgilia Quigg, Junie Panning K, PA-C  ?doxycycline (VIBRAMYCIN) 100 MG capsule Take 1 capsule (100 mg total) by mouth 2 (two) times daily. 12/10/21  Yes Surafel Hilleary K, PA-C  ?acetaminophen (TYLENOL) 650 MG CR tablet Take by mouth.    [provider]  ?albuterol (VENTOLIN HFA) 108 (90 Base) MCG/ACT inhaler Inhale 2 puffs into the lungs  every 4 (four) hours as needed. 08/29/21   Margarette Canada, NP  ?alendronate (FOSAMAX) 70 MG tablet Take 70 mg by mouth once a week. 08/25/20   [provider]  ?alendronate (FOSAMAX) 70 MG tablet Take by mouth. 07/02/21 07/02/22  [provider]  ?Celedonio Miyamoto 62.5-25 MCG/INH AEPB Inhale 1 puff into the lungs daily. 09/10/20   [provider]  ?benzonatate (TESSALON) 100 MG capsule Take 2 capsules (200 mg total) by mouth 3 (three) times daily as needed for cough. 12/10/21   Cleaven Demario,  Derry Skill, PA-C  ?Budeson-Glycopyrrol-Formoterol (BREZTRI AEROSPHERE) 160-9-4.8 MCG/ACT AERO Inhale into the lungs. 11/19/21 11/19/22  [provider]  ?butalbital-acetaminophen-caffeine (FIORICET) 50-325-40 MG tablet Take 1 tablet by mouth every 6 (six) hours as needed. 01/18/21   [provider]  ?Calcium Carbonate-Vitamin D 600-400 MG-UNIT tablet Take 1 tablet by mouth daily.    [provider]  ?Cholecalciferol 25 MCG (1000 UT) capsule Take by mouth.    [provider]  ?dexamethasone (DECADRON) 0.5 MG tablet Take by mouth. 07/09/21   [provider]  ?dexamethasone (DECADRON) 0.5 MG tablet Take 0.5 mg by mouth daily. 07/09/21   [provider]  ?dexamethasone (DECADRON) 2 MG tablet Take by mouth. 01/26/21   [provider]  ?Docusate Sodium (DSS) 250 MG CAPS Take by mouth.    [provider]  ?fluticasone Asencion Islam) 50 MCG/ACT nasal spray SMARTSIG:1-2 Spray(s) Both Nares Daily PRN 08/31/20   [provider]  ?fluticasone (FLONASE) 50 MCG/ACT nasal spray Place into the nose. 03/06/20   [provider]  ?gabapentin (NEURONTIN) 300 MG capsule SMARTSIG:1-2 Capsule(s) By Mouth Every Evening 08/31/20   [provider]  ?gabapentin (NEURONTIN) 300 MG capsule Take by mouth. 09/14/21 09/14/22  [provider]  ?HYDROcodone-acetaminophen (NORCO) 10-325 MG tablet Take by mouth. 09/26/21   [provider]  ?HYDROmorphone (DILAUDID) 4 MG tablet Take 4 mg by mouth every 4 (four) hours as needed. 08/25/20   [provider]  ?KLOR-CON M20 20 MEQ tablet Take 20 mEq by mouth 2 (two) times daily. 08/06/21   [provider]  ?losartan-hydrochlorothiazide (HYZAAR) 100-25 MG tablet Take 1 tablet by mouth daily. 03/13/21   [provider]  ?OLANZapine (ZYPREXA) 2.5 MG tablet Take 2.5 mg by mouth at bedtime. 08/27/20   [provider]  ?OLANZapine (ZYPREXA) 2.5 MG tablet Take 1 tablet by mouth at bedtime.  07/09/21   [provider]  ?OLANZapine (ZYPREXA) 5 MG tablet Take by mouth. 10/02/18 12/31/18  [provider]  ?ondansetron (ZOFRAN ODT) 4 MG disintegrating tablet Take 1 tablet (4 mg total) by mouth every 8 (eight) hours as needed for nausea or vomiting. 04/08/21   Coral Spikes, DO  ?pantoprazole (PROTONIX) 40 MG tablet Take by mouth. 01/18/21 01/18/22  [provider]  ?pantoprazole (PROTONIX) 40 MG tablet Take by mouth. 09/14/21 09/14/22  [provider]  ?potassium chloride (KLOR-CON) 10 MEQ tablet Take 20 mEq by mouth 2 (two) times daily. 09/10/21   [provider]  ?potassium chloride (MICRO-K) 10 MEQ CR capsule Take by mouth. 09/14/21 09/14/22  [provider]  ?promethazine-dextromethorphan (PROMETHAZINE-DM) 6.25-15 MG/5ML syrup Take 5 mLs by mouth 4 (four) times daily as needed. 08/29/21   Margarette Canada, NP  ?simvastatin (ZOCOR) 10 MG tablet Take by mouth. 03/12/21   [provider]  ?simvastatin (ZOCOR) 10 MG tablet Take 1 tablet by mouth at bedtime. 09/14/21   [provider]  ?Spacer/Aero-Holding Chambers (AEROCHAMBER MV) inhaler  Use as instructed 08/29/21   Margarette Canada, NP  ?tamsulosin (FLOMAX) 0.4 MG CAPS capsule Take 0.4 mg by mouth daily. 09/17/21   [provider]  ?tamsulosin (FLOMAX) 0.4 MG CAPS capsule Take by mouth. 09/17/21 09/17/22  [provider]  ?venlafaxine XR (EFFEXOR-XR) 75 MG 24 hr capsule Take 75 mg by mouth daily. 09/17/20   [provider]  ?Vitamin D, Ergocalciferol, (DRISDOL) 1.25 MG (50000 UT) CAPS capsule Take 1 capsule by mouth weekly for 12 weeks 10/12/18   [provider]  ?cloNIDine (CATAPRES) 0.1 MG tablet Take by mouth. 11/09/18 10/11/20  [provider]  ? ? ?Family History ?Family History  ?Problem Relation Age of Onset  ? Heart attack Mother   ? Hypertension Mother   ? Hypertension Father   ? Hyperlipidemia Father   ? Breast cancer Neg Hx   ? ? ?Social History ?Social History   ? ?Tobacco Use  ? Smoking status: Never  ? Smokeless tobacco: Never  ?Vaping Use  ? Vaping Use: Never used  ?Substance Use Topics  ? Alcohol use: No  ? Drug use: No  ? ? ? ?Allergies   ?Patient has no

## 2021-12-10 NOTE — ED Triage Notes (Signed)
Patient is here for "st". Concerned with having Pna. Fever "yesterday". Nausea "this am". Cough "congested". Restarted Augmenting (on day 2). Last known Fever "100.7".  ?

## 2021-12-16 IMAGING — CR DG CHEST 2V
2 series · 2 of 2 positions shown · non-contrast
Comparison: 06/23/2019

CLINICAL DATA: Left-sided back pain and shortness of breath.
History of stage IV lung cancer.

EXAM:
CHEST - 2 VIEW

[chest pa]
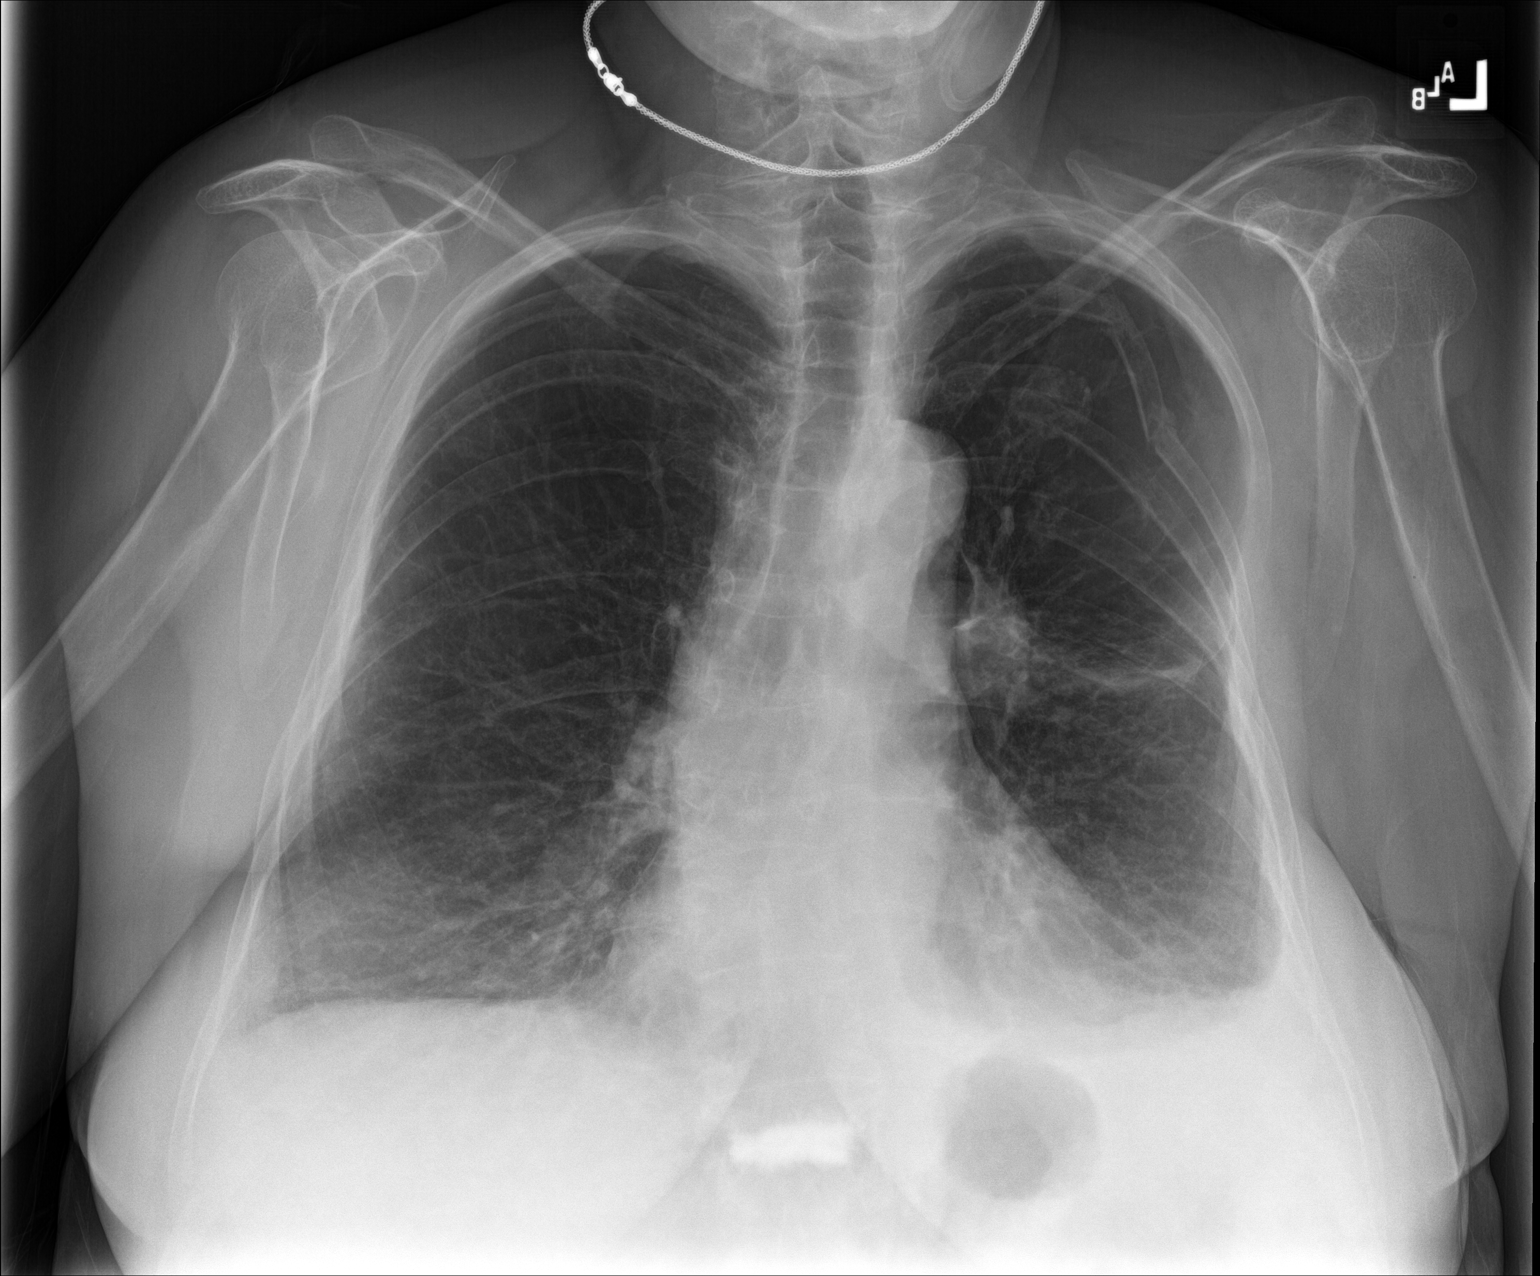

[chest lat]
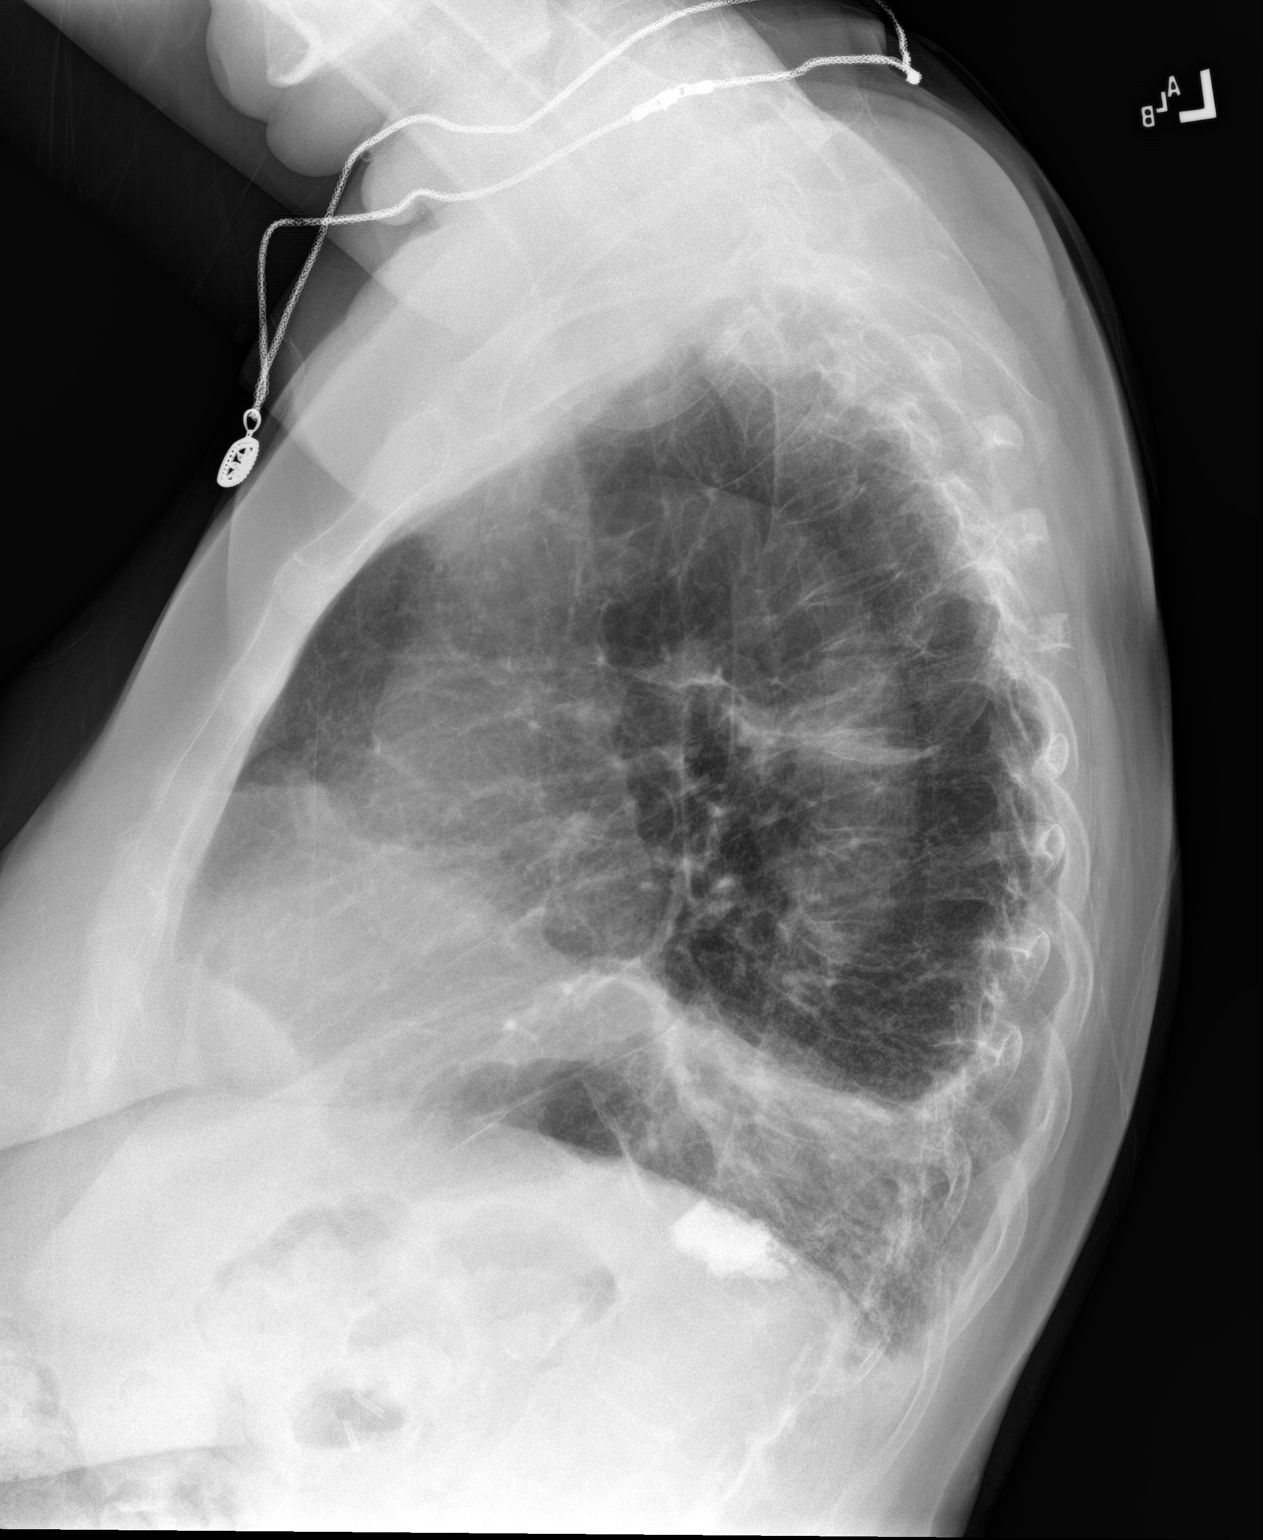

[2 of 2 positions shown; findings below may reference images not displayed]

FINDINGS: The cardiomediastinal silhouette is unchanged with normal heart
size. Lung volumes are lower than on the prior study. There is
chronic linear opacity in the left mid lung suggesting scarring.
There are new small left and trace right pleural effusions, and
there is mild airspace opacity in the left lung base. There is
underlying emphysema. There are chronic posterior left fourth,
fifth, and sixth rib fractures with increased displacement compared
to the prior study. Chronic thoracic compression fractures are again
noted, and there is been prior augmentation of a compression
fracture near the thoracolumbar junction.
IMPRESSION: New small left and trace right pleural effusions with mild left
basilar opacity, which may reflect atelectasis or early infection.

## 2022-01-07 ENCOUNTER — Ambulatory Visit
Admission: EM | Admit: 2022-01-07 | Discharge: 2022-01-07 | Disposition: A | Discharge status: Home / Self Care | Payer: Medicare Other | Attending: Emergency Medicine | Admitting: Emergency Medicine

## 2022-01-07 ENCOUNTER — Ambulatory Visit (INDEPENDENT_AMBULATORY_CARE_PROVIDER_SITE_OTHER): Payer: Medicare Other

## 2022-01-07 ENCOUNTER — Encounter: Payer: Self-pay | Admitting: Emergency Medicine

## 2022-01-07 DIAGNOSIS — R051 Acute cough: Secondary | ICD-10-CM | POA: Diagnosis not present

## 2022-01-07 DIAGNOSIS — J189 Pneumonia, unspecified organism: Secondary | ICD-10-CM

## 2022-01-07 MED ORDER — AZITHROMYCIN 250 MG PO TABS: 250.0000 mg | ORAL_TABLET | Freq: Every day | ORAL | 0 refills | Status: DC

## 2022-01-07 MED ORDER — PREDNISONE 50 MG PO TABS: ORAL_TABLET | ORAL | 0 refills | Status: DC

## 2022-01-07 MED ORDER — PROMETHAZINE-DM 6.25-15 MG/5ML PO SYRP: 5.0000 mL | ORAL_SOLUTION | Freq: Four times a day (QID) | ORAL | 0 refills | Status: DC | PRN

## 2022-01-07 MED ORDER — AMOXICILLIN-POT CLAVULANATE 875-125 MG PO TABS: 1.0000 | ORAL_TABLET | Freq: Two times a day (BID) | ORAL | 0 refills | Status: AC

## 2022-01-07 MED ORDER — BENZONATATE 100 MG PO CAPS: 200.0000 mg | ORAL_CAPSULE | Freq: Three times a day (TID) | ORAL | 0 refills | Status: DC

## 2022-01-07 NOTE — Discharge Instructions (Addendum)
Take the Augmentin twice daily with food for 10 days for treatment of your pneumonia. ? ?Take the azithromycin as directed.  You will take 2 tablets on day 1 and then 1 tablet each day after that for total of 5 days for treatment of your pneumonia. ? ?Use the albuterol inhaler with a spacer, 2 puffs every 4-6 hours, as needed for shortness of breath or wheezing. ? ?Use the Tessalon Perles every 8 hours during the day as needed for cough.  Taken with a small sip of water.  These may give you numbness to the base of your tongue or metallic taste in mouth, this is normal. ? ?Use the Promethazine DM cough syrup at bedtime for cough and congestion as it would make you drowsy. ? ?Take the prednisone once daily for 5 days.  Take this at breakfast time with food. ? ?Return for reevaluation if you have any new or worsening symptoms. ? ?Follow-up with your primary care provider in 4 to 6 weeks for a repeat chest x-ray to ensure resolution of your pneumonia.  ?

## 2022-01-07 NOTE — ED Triage Notes (Signed)
Pt reports fatigue, nasal congestion, coughing up mucous, fever, nausea and body aches on left side. States started coughing up mucous over the weekend and woke up with other symptoms this morning. States hx of stage 4 lung cancer and pneumonia. States she frequently gets pneumonia in left lung.  ?

## 2022-01-07 NOTE — ED Provider Notes (Signed)
?[facility] ? ? ? ?CSN: [id number] ?Arrival date & time: 01/07/22  1158 ? ? ?  ? ?History   ?Chief Complaint ?Chief Complaint  ?Patient presents with  ? Cough  ? Nausea  ? Fatigue  ? Fever  ? Headache  ? ? ?HPI ?Nichole Zhang is a 61 y.o. female.  ? ?HPI ? ?61 year old female here for evaluation respiratory complaints. ? ?Patient reports that she has been experiencing subjective fever, fatigue, nasal congestion with clear nasal discharge, productive cough for green sputum, shortness of breath and wheezing above baseline, nausea, and body aches.  She has a history of stage IV lung CA.  She was evaluated a month ago for similar symptoms and treated with doxycycline and she feels like she never completely got over her symptoms. ? ?Past Medical History:  ?Diagnosis Date  ? Cancer [facility])   ? stage IV lung  ? ? ?Patient Active Problem List  ? Diagnosis Date Noted  ? Chronic midline thoracic back pain 12/10/2021  ? Hot flashes 09/13/2021  ? Hypokalemia 08/06/2021  ? Chronic, continuous use of opioids 06/15/2021  ? Claustrophobia 06/15/2021  ? Requires continuous at home supplemental oxygen 06/15/2021  ? Adnexal mass 04/24/2021  ? Essential hypertension 03/18/2021  ? Hypercholesterolemia 03/18/2021  ? Situational depression 03/18/2021  ? Chronic nausea 07/25/2019  ? Chronic inflammatory arthritis 06/15/2019  ? COPD (chronic obstructive pulmonary disease) ([place]) 12/08/2018  ? Drug-induced osteoporosis 11/26/2018  ? History of diverticulitis 06/15/2018  ? Pes anserine bursitis 04/02/2018  ? Peripheral edema 05/15/2017  ? Autoimmune hepatitis ([place]) 04/27/2017  ? Transaminitis 04/27/2017  ? Convalescence after chemotherapy 04/24/2017  ? Metastatic primary lung cancer, left ([place]) 03/20/2017  ? Lung mass 03/13/2017  ? Malignant neoplasm metastatic to brain [facility]) 03/13/2017  ? ? ?Past Surgical History:  ?Procedure Laterality Date  ? BACK SURGERY    ? T12  ? CHOLECYSTECTOMY    ? EXTRACORPOREAL SHOCK WAVE LITHOTRIPSY  Right 09/27/2021  ? Procedure: EXTRACORPOREAL SHOCK WAVE LITHOTRIPSY (ESWL);  Surgeon: Royston Cowper, MD;  Location: [facility] ORS;  Service: Urology;  Laterality: Right;  ? GALLBLADDER SURGERY    ? ? ?OB History   ?No obstetric history on file. ?  ? ? ? ?Home Medications   ? ?Prior to Admission medications   ?Medication Sig Start Date End Date Taking? Authorizing Provider  ?amoxicillin-clavulanate (AUGMENTIN) 875-125 MG tablet Take 1 tablet by mouth every 12 (twelve) hours for 10 days. 01/07/22 01/17/22 Yes Margarette Canada, NP  ?azithromycin (ZITHROMAX Z-PAK) 250 MG tablet Take 1 tablet (250 mg total) by mouth daily. Take 2 tablets on the first day and then 1 tablet daily thereafter for a total of 5 days of treatment. 01/07/22  Yes Margarette Canada, NP  ?benzonatate (TESSALON) 100 MG capsule Take 2 capsules (200 mg total) by mouth every 8 (eight) hours. 01/07/22  Yes Margarette Canada, NP  ?predniSONE (DELTASONE) 50 MG tablet Take 1 tablet daily by mouth for 5 days. 01/07/22  Yes Margarette Canada, NP  ?promethazine-dextromethorphan (PROMETHAZINE-DM) 6.25-15 MG/5ML syrup Take 5 mLs by mouth 4 (four) times daily as needed. 01/07/22  Yes Margarette Canada, NP  ?acetaminophen (TYLENOL) 650 MG CR tablet Take by mouth.    [provider]  ?albuterol (VENTOLIN HFA) 108 (90 Base) MCG/ACT inhaler Inhale 2 puffs into the lungs every 4 (four) hours as needed. 08/29/21   Margarette Canada, NP  ?alendronate (FOSAMAX) 70 MG tablet Take 70 mg by mouth once a week. 08/25/20   [provider]  ?alendronate (FOSAMAX) 70 MG tablet Take by mouth. 07/02/21 07/02/22  [provider]  ?Celedonio Miyamoto 62.5-25 MCG/INH AEPB Inhale 1 puff into the lungs daily. 09/10/20   [provider]  ?Budeson-Glycopyrrol-Formoterol (BREZTRI AEROSPHERE) 160-9-4.8 MCG/ACT AERO Inhale into the lungs. 11/19/21 11/19/22  [provider]  ?butalbital-acetaminophen-caffeine (FIORICET) 50-325-40 MG tablet Take 1 tablet by mouth every 6 (six) hours as needed.  01/18/21   [provider]  ?Calcium Carbonate-Vitamin D 600-400 MG-UNIT tablet Take 1 tablet by mouth daily.    [provider]  ?Cholecalciferol 25 MCG (1000 UT) capsule Take by mouth.    [provider]  ?dexamethasone (DECADRON) 0.5 MG tablet Take by mouth. 07/09/21   [provider]  ?dexamethasone (DECADRON) 0.5 MG tablet Take 0.5 mg by mouth daily. 07/09/21   [provider]  ?dexamethasone (DECADRON) 2 MG tablet Take by mouth. 01/26/21   [provider]  ?Docusate Sodium (DSS) 250 MG CAPS Take by mouth.    [provider]  ?fluticasone Asencion Islam) 50 MCG/ACT nasal spray SMARTSIG:1-2 Spray(s) Both Nares Daily PRN 08/31/20   [provider]  ?fluticasone (FLONASE) 50 MCG/ACT nasal spray Place into the nose. 03/06/20   [provider]  ?gabapentin (NEURONTIN) 300 MG capsule SMARTSIG:1-2 Capsule(s) By Mouth Every Evening 08/31/20   [provider]  ?gabapentin (NEURONTIN) 300 MG capsule Take by mouth. 09/14/21 09/14/22  [provider]  ?HYDROcodone-acetaminophen (NORCO) 10-325 MG tablet Take by mouth. 09/26/21   [provider]  ?HYDROmorphone (DILAUDID) 4 MG tablet Take 4 mg by mouth every 4 (four) hours as needed. 08/25/20   [provider]  ?KLOR-CON M20 20 MEQ tablet Take 20 mEq by mouth 2 (two) times daily. 08/06/21   [provider]  ?losartan-hydrochlorothiazide (HYZAAR) 100-25 MG tablet Take 1 tablet by mouth daily. 03/13/21   [provider]  ?OLANZapine (ZYPREXA) 2.5 MG tablet Take 2.5 mg by mouth at bedtime. 08/27/20   [provider]  ?OLANZapine (ZYPREXA) 2.5 MG tablet Take 1 tablet by mouth at bedtime. 07/09/21   [provider]  ?OLANZapine (ZYPREXA) 5 MG tablet Take by mouth. 10/02/18 12/31/18  [provider]  ?ondansetron (ZOFRAN ODT) 4 MG disintegrating tablet Take 1 tablet (4 mg total) by mouth every 8 (eight) hours as needed for nausea or vomiting.  04/08/21   Coral Spikes, DO  ?pantoprazole (PROTONIX) 40 MG tablet Take by mouth. 01/18/21 01/18/22  [provider]  ?pantoprazole (PROTONIX) 40 MG tablet Take by mouth. 09/14/21 09/14/22  [provider]  ?potassium chloride (KLOR-CON) 10 MEQ tablet Take 20 mEq by mouth 2 (two) times daily. 09/10/21   [provider]  ?potassium chloride (MICRO-K) 10 MEQ CR capsule Take by mouth. 09/14/21 09/14/22  [provider]  ?simvastatin (ZOCOR) 10 MG tablet Take by mouth. 03/12/21   [provider]  ?simvastatin (ZOCOR) 10 MG tablet Take 1 tablet by mouth at bedtime. 09/14/21   [provider]  ?Spacer/Aero-Holding Chambers (AEROCHAMBER MV) inhaler Use as instructed 08/29/21   Margarette Canada, NP  ?tamsulosin (FLOMAX) 0.4 MG CAPS capsule Take 0.4 mg by mouth daily. 09/17/21   [provider]  ?tamsulosin (FLOMAX) 0.4 MG CAPS capsule Take by mouth. 09/17/21 09/17/22  [provider]  ?venlafaxine XR (EFFEXOR-XR) 75 MG 24 hr capsule Take 75 mg by mouth daily. 09/17/20   [provider]  ?Vitamin D, Ergocalciferol, (DRISDOL) 1.25 MG (50000 UT) CAPS capsule Take 1 capsule by mouth weekly  for 12 weeks 10/12/18   [provider]  ?cloNIDine (CATAPRES) 0.1 MG tablet Take by mouth. 11/09/18 10/11/20  [provider]  ? ? ?Family History ?Family History  ?Problem Relation Age of Onset  ? Heart attack Mother   ? Hypertension Mother   ? Hypertension Father   ? Hyperlipidemia Father   ? Breast cancer Neg Hx   ? ? ?Social History ?Social History  ? ?Tobacco Use  ? Smoking status: Never  ? Smokeless tobacco: Never  ?Vaping Use  ? Vaping Use: Never used  ?Substance Use Topics  ? Alcohol use: No  ? Drug use: No  ? ? ? ?Allergies   ?Patient has no known allergies. ? ? ?Review of Systems ?Review of Systems  ?Constitutional:  Positive for fatigue and fever.  ?HENT:  Positive for congestion and rhinorrhea.   ?Respiratory:  Positive for cough, shortness of breath and  wheezing.   ?Gastrointestinal:  Positive for nausea.  ?Hematological: Negative.   ?Psychiatric/Behavioral: Negative.    ? ? ?Physical Exam ?Triage Vital Signs ?ED Triage Vitals  ?Enc Vitals Group  ?   BP

## 2022-06-13 ENCOUNTER — Ambulatory Visit
Admission: RE | Admit: 2022-06-13 | Discharge: 2022-06-13 | Disposition: A | Discharge status: Home / Self Care | Payer: Medicare Other | Source: Ambulatory Visit | Attending: Physician Assistant | Admitting: Physician Assistant

## 2022-06-13 ENCOUNTER — Ambulatory Visit (INDEPENDENT_AMBULATORY_CARE_PROVIDER_SITE_OTHER): Payer: Medicare Other

## 2022-06-13 ENCOUNTER — Other Ambulatory Visit: Payer: Self-pay

## 2022-06-13 VITALS — BP 110/74 | HR 90 | Temp 98.7°F | Resp 20 | Ht 65.0 in | Wt 190.0 lb

## 2022-06-13 DIAGNOSIS — C3490 Malignant neoplasm of unspecified part of unspecified bronchus or lung: Secondary | ICD-10-CM

## 2022-06-13 DIAGNOSIS — R0602 Shortness of breath: Secondary | ICD-10-CM

## 2022-06-13 DIAGNOSIS — J22 Unspecified acute lower respiratory infection: Secondary | ICD-10-CM | POA: Diagnosis not present

## 2022-06-13 DIAGNOSIS — R079 Chest pain, unspecified: Secondary | ICD-10-CM | POA: Diagnosis not present

## 2022-06-13 MED ORDER — PREDNISONE 20 MG PO TABS: 40.0000 mg | ORAL_TABLET | Freq: Every day | ORAL | 0 refills | Status: AC

## 2022-06-13 MED ORDER — AZITHROMYCIN 250 MG PO TABS: 250.0000 mg | ORAL_TABLET | Freq: Every day | ORAL | 0 refills | Status: AC

## 2022-06-13 MED ORDER — PREDNISONE 50 MG PO TABS: ORAL_TABLET | ORAL | 0 refills | Status: AC

## 2022-06-13 MED ORDER — AMOXICILLIN-POT CLAVULANATE 875-125 MG PO TABS
1.0000 | ORAL_TABLET | Freq: Two times a day (BID) | ORAL | 0 refills | Status: AC
Start: 2022-06-13 — End: 2022-06-20

## 2022-06-13 NOTE — ED Provider Notes (Signed)
[facility]    CSN: [id number] Arrival date & time: 06/13/22  1119      History   Chief Complaint Chief Complaint  Patient presents with   Shortness of Breath   Fatigue    HPI Nichole Zhang is a 61 y.o. female with stage IV NSCLC lung cancer (left lower lobe) and brain metastasis status post chemo/XRT.  Has emphysema as well. Patient also has cancer related back pain.  Patient is on palliative care.  Patient presents for fatigue, diarrhea, cough, right sided rib pain, right sided pleuritic pain, and increased SOB x 2-3 weeks, worsening over the past couple of days.  Patient is on supplemental O2 and is currently using 2.5 L.  She says that she normally does not have to use oxygen very often, only if she is exerting herself.  Patient takes hydromorphone 4 mg PO q3-4 hr PRN about 3-4 days per week as well as gabapentin for pain management.  Patient reports that she saw her PCP on 05/24/2022 and they gave her doxycycline for suspected pneumonia.  She has history of recurrent pneumonia and believes that they did not give her strong enough medication and she needs Augmentin and azithromycin as well as prednisone because that is normally what she receives and she feels better within a couple of days.  Patient says that "something ain't right" with her right sided rib pain and fatigue.  She says that it is worse than normal.  Admits to increased pain on breathing.  States that the pain is intermittent.  She does have pain when she presses on her right ribs.  Denies any associated dizziness, weakness, palpitations.  Additionally, patient denies any history of cardiac disease.  Denies any history of DVT or PE.  Patient also states that she has history of broken ribs on the left side.  Denies any new injury.  Patient is fully vaccinated for COVID 19 and received booster.  Patient denies any recent known COVID-19 exposure.  Denies any fever, body aches, sore throat, nasal congestion,  headaches, abdominal pain, or change in smell or taste.  Patient denies any other complaints or concerns this time.  HPI  Past Medical History:  Diagnosis Date   Cancer ([place])    stage IV lung    Patient Active Problem List   Diagnosis Date Noted   Chronic midline thoracic back pain 12/10/2021   Hot flashes 09/13/2021   Hypokalemia 08/06/2021   Chronic, continuous use of opioids 06/15/2021   Claustrophobia 06/15/2021   Requires continuous at home supplemental oxygen 06/15/2021   Adnexal mass 04/24/2021   Essential hypertension 03/18/2021   Hypercholesterolemia 03/18/2021   Situational depression 03/18/2021   Chronic nausea 07/25/2019   Chronic inflammatory arthritis 06/15/2019   COPD (chronic obstructive pulmonary disease) ([place]) 12/08/2018   Drug-induced osteoporosis 11/26/2018   History of diverticulitis 06/15/2018   Pes anserine bursitis 04/02/2018   Peripheral edema 05/15/2017   Autoimmune hepatitis ([place]) 04/27/2017   Transaminitis 04/27/2017   Convalescence after chemotherapy 04/24/2017   Metastatic primary lung cancer, left ([place]) 03/20/2017   Lung mass 03/13/2017   Malignant neoplasm metastatic to brain ([place]) 03/13/2017     Past Surgical History:  Procedure Laterality Date   BACK SURGERY     T12   CHOLECYSTECTOMY     EXTRACORPOREAL SHOCK WAVE LITHOTRIPSY Right 09/27/2021   Procedure: EXTRACORPOREAL SHOCK WAVE LITHOTRIPSY (ESWL);  Surgeon: Royston Cowper, MD;  Location: [facility] ORS;  Service: Urology;  Laterality: Right;   GALLBLADDER  SURGERY      OB History   No obstetric history on file.      Home Medications    Prior to Admission medications   Medication Sig Start Date End Date Taking? Authorizing Provider  amoxicillin-clavulanate (AUGMENTIN) 875-125 MG tablet Take 1 tablet by mouth every 12 (twelve) hours for 7 days. 06/13/22 06/20/22 Yes Danton Clap, PA-C  azithromycin (ZITHROMAX) 250 MG tablet Take 1 tablet (250 mg total) by mouth daily. Take first  2 tablets together, then 1 every day until finished. 06/13/22  Yes Danton Clap, PA-C  predniSONE (DELTASONE) 20 MG tablet Take 2 tablets (40 mg total) by mouth daily for 5 days. 06/13/22 06/18/22 Yes Danton Clap, PA-C  acetaminophen (TYLENOL) 650 MG CR tablet Take by mouth.    [provider]  ADULT ASPIRIN EC LOW STRENGTH PO INHALE 1 PUFF BY MOUTH INTO THE LUNGS ONCE DAILY    [provider]  ADULT ASPIRIN EC LOW STRENGTH PO Take 1 tablet by mouth daily.    [provider]  albuterol (VENTOLIN HFA) 108 (90 Base) MCG/ACT inhaler Inhale 2 puffs into the lungs every 4 (four) hours as needed. 08/29/21   Margarette Canada, NP  alendronate (FOSAMAX) 70 MG tablet Take 70 mg by mouth once a week. 08/25/20   [provider]  alendronate (FOSAMAX) 70 MG tablet Take by mouth. 07/02/21 07/02/22  [provider]  ANORO ELLIPTA 62.5-25 MCG/INH AEPB Inhale 1 puff into the lungs daily. 09/10/20   [provider]  Budeson-Glycopyrrol-Formoterol (BREZTRI AEROSPHERE) 160-9-4.8 MCG/ACT AERO Inhale into the lungs. 11/19/21 11/19/22  [provider]  butalbital-acetaminophen-caffeine (FIORICET) 50-325-40 MG tablet Take 1 tablet by mouth every 6 (six) hours as needed. 01/18/21   [provider]  Calcium Carbonate-Vitamin D 600-400 MG-UNIT tablet Take 1 tablet by mouth daily.    [provider]  Cholecalciferol 25 MCG (1000 UT) capsule Take by mouth.    [provider]  dexamethasone (DECADRON) 0.5 MG tablet Take by mouth. 07/09/21   [provider]  dexamethasone (DECADRON) 0.5 MG tablet Take 0.5 mg by mouth daily. 07/09/21   [provider]  dexamethasone (DECADRON) 2 MG tablet Take by mouth. 01/26/21   [provider]  Docusate Sodium (DSS) 250 MG CAPS Take by mouth.    [provider]  fluticasone (FLONASE) 50 MCG/ACT nasal spray SMARTSIG:1-2 Spray(s) Both Nares Daily PRN 08/31/20   [provider]  fluticasone (FLONASE) 50 MCG/ACT nasal spray Place into the nose. 03/06/20   [provider]  gabapentin (NEURONTIN) 300 MG capsule SMARTSIG:1-2 Capsule(s) By Mouth Every Evening 08/31/20   [provider]  gabapentin (NEURONTIN) 300 MG capsule Take by mouth. 09/14/21 09/14/22  [provider]  HYDROcodone-acetaminophen (NORCO) 10-325 MG tablet Take by mouth. 09/26/21   [provider]  HYDROmorphone (DILAUDID) 4 MG tablet Take 4 mg by mouth every 4 (four) hours as needed. 08/25/20   [provider]  KLOR-CON M20 20 MEQ tablet Take 20 mEq by mouth 2 (two) times daily. 08/06/21   [provider]  losartan-hydrochlorothiazide (HYZAAR) 100-25 MG tablet Take 1 tablet by mouth daily. 03/13/21   [provider]  OLANZapine (ZYPREXA) 2.5 MG tablet Take 2.5 mg by mouth at bedtime. 08/27/20   [provider]  OLANZapine (ZYPREXA) 2.5 MG tablet Take 1 tablet by mouth at bedtime. 07/09/21   [provider]  OLANZapine (ZYPREXA) 5 MG tablet Take by mouth. 10/02/18 12/31/18  [provider]  ondansetron (ZOFRAN ODT) 4 MG disintegrating tablet Take 1 tablet (4 mg total) by mouth every 8 (eight) hours as needed for nausea or vomiting. 04/08/21   Coral Spikes, DO  pantoprazole (PROTONIX) 40 MG tablet Take by mouth. 01/18/21 01/18/22  [provider]  pantoprazole (PROTONIX) 40 MG tablet Take by mouth. 09/14/21 09/14/22  [provider]  potassium chloride (KLOR-CON) 10 MEQ tablet Take 20 mEq by mouth 2 (two) times daily. 09/10/21   [provider]  potassium chloride (MICRO-K) 10 MEQ CR capsule Take by mouth. 09/14/21 09/14/22  [provider]  predniSONE (DELTASONE) 50 MG tablet Take 1 tablet daily by mouth for 5 days. 06/13/22   Laurene Footman B, PA-C  simvastatin (ZOCOR) 10 MG tablet Take by mouth. 03/12/21   [provider]  simvastatin (ZOCOR) 10 MG tablet Take 1 tablet by mouth at  bedtime. 09/14/21   [provider]  Spacer/Aero-Holding Chambers (AEROCHAMBER MV) inhaler Use as instructed 08/29/21   Margarette Canada, NP  tamsulosin (FLOMAX) 0.4 MG CAPS capsule Take 0.4 mg by mouth daily. 09/17/21   [provider]  tamsulosin (FLOMAX) 0.4 MG CAPS capsule Take by mouth. 09/17/21 09/17/22  [provider]  venlafaxine XR (EFFEXOR-XR) 75 MG 24 hr capsule Take 75 mg by mouth daily. 09/17/20   [provider]  Vitamin D, Ergocalciferol, (DRISDOL) 1.25 MG (50000 UT) CAPS capsule Take 1 capsule by mouth weekly for 12 weeks 10/12/18   [provider]  cloNIDine (CATAPRES) 0.1 MG tablet Take by mouth. 11/09/18 10/11/20  [provider]    Family History Family History  Problem Relation Age of Onset   Heart attack Mother    Hypertension Mother    Hypertension Father    Hyperlipidemia Father    Breast cancer Neg Hx     Social History Social History   Tobacco Use   Smoking status: Never   Smokeless tobacco: Never  Vaping Use   Vaping Use: Never used  Substance Use Topics   Alcohol use: No   Drug use: No     Allergies   Patient has no known allergies.   Review of Systems Review of Systems  Constitutional:  Positive for fatigue. Negative for chills, diaphoresis and fever.  HENT:  Negative for congestion, ear pain, rhinorrhea, sinus pressure, sinus pain and sore throat.   Respiratory:  Positive for cough and shortness of breath.   Cardiovascular:  Positive for chest pain. Negative for palpitations.  Gastrointestinal:  Positive for diarrhea and nausea. Negative for abdominal pain and vomiting.  Musculoskeletal:  Positive for back pain (chronic). Negative for arthralgias and myalgias.  Skin:  Negative for rash.  Neurological:  Negative for weakness and headaches.  Hematological:  Negative for adenopathy.     Physical Exam Triage Vital Signs ED Triage Vitals  Enc Vitals Group     BP 08/14/20 1000 (!) 141/94     Pulse  Rate 08/14/20 1000 90     Resp 08/14/20 1000 20     Temp 08/14/20 1000 98.1 F (36.7 C)     Temp Source 08/14/20 1000 Oral     SpO2 08/14/20 1000 96 %     Weight 08/14/20 0956 180 lb (81.6 kg)     Height 08/14/20 0956 5\' 7"  (1.702 m)     Head Circumference --      Peak Flow --      Pain Score 08/14/20 0955 10     Pain Loc --  Pain Edu? --      Excl. in [place]? --    No data found.  Updated Vital Signs BP 110/74 (BP Location: Left Arm)   Pulse 90   Temp 98.7 F (37.1 C) (Oral)   Resp 20   Ht 5\' 5"  (1.651 m)   Wt 190 lb (86.2 kg)   SpO2 100%   BMI 31.62 kg/m       Physical Exam Vitals and nursing note reviewed.  Constitutional:      General: She is not in acute distress.    Appearance: Normal appearance. She is ill-appearing (mildly, chronic illness, using supplemental O2). She is not toxic-appearing.  HENT:     Head: Normocephalic and atraumatic.     Nose: Congestion and rhinorrhea (clear) present.     Mouth/Throat:     Mouth: Mucous membranes are moist.     Pharynx: Oropharynx is clear.  Eyes:     General: No scleral icterus.       Right eye: No discharge.        Left eye: No discharge.     Conjunctiva/sclera: Conjunctivae normal.  Cardiovascular:     Rate and Rhythm: Normal rate and regular rhythm.     Heart sounds: Normal heart sounds.  Pulmonary:     Effort: Pulmonary effort is normal. No respiratory distress.     Breath sounds: Normal breath sounds. No wheezing, rhonchi or rales.  Chest:     Chest wall: No tenderness.  Musculoskeletal:     Cervical back: Neck supple.     Thoracic back: Tenderness (left parathoracic region diffusely) present.     Right lower leg: No edema.     Left lower leg: No edema.  Skin:    General: Skin is dry.  Neurological:     General: No focal deficit present.     Mental Status: She is alert. Mental status is at baseline.     Motor: No weakness.     Gait: Gait normal.  Psychiatric:        Mood and Affect: Mood normal.         Behavior: Behavior normal.        Thought Content: Thought content normal.      UC Treatments / Results  Labs (all labs ordered are listed, but only abnormal results are displayed) Labs Reviewed - No data to display   EKG   Radiology DG Chest 2 View  Result Date: 06/13/2022 CLINICAL DATA:  Shortness of breath. Metastatic lung cancer. Nausea and diarrhea. Bilateral chest pain. EXAM: CHEST - 2 VIEW COMPARISON:  01/07/2022 FINDINGS: Stable left upper rib deformities including nonunited fractures. Stable scarring in the left mid lung. Linear subsegmental atelectasis or scarring at both lung bases. Atherosclerotic calcification of the aortic arch. Density projecting over the left upper lobe is probably callus/woven bone associated with rib fractures, less likely true pulmonary nodule. No blunting of the costophrenic angles. Stable mid and lower thoracic compression fractures. IMPRESSION: 1. Stable scarring in the left mid lung and both lung bases. 2. Density projecting over the left upper lobe is probably callus/woven bone associated with rib fractures, less likely true pulmonary nodule. 3. Atherosclerotic calcification of the aortic arch. 4. Stable mid and lower thoracic compression fractures. Electronically Signed   By: Van Clines M.D.   On: 06/13/2022 12:43     Procedures Procedures (including critical care time)  Medications Ordered in UC Medications - No data to display  Initial Impression / Assessment and Plan /  UC Course  I have reviewed the triage vital signs and the nursing notes.  Pertinent labs & imaging results that were available during my care of the patient were reviewed by me and considered in my medical decision making (see chart for details).   61 year-old female with history of stage IV lung cancer (specifically mass of the left lower lobe)  presenting for onset of fatigue, cough, shortness of breath, right-sided rib pain and pleuritic pain on the right  side for the past couple of weeks.  Has already taken doxycycline for suspected pneumonia couple weeks ago without improvement in symptoms.  Request Augmentin, azithromycin and prednisone because that is helped in the past and she does have a history of recurrent pneumonia and emphysema.  Vital signs are stable.  Oxygen is 100% on 2.5 to 3 L oxygen.   She is in no acute distress.  Breath sounds decreased in the left lower lobe, but no wheezes, rales, or rhonchi.    Chest x-ray shows no clear infiltrates.  It does show suspicion for old rib fractures, stable mid and lower thoracic compression fractures and scarring of the left mid and lower lung bases.  Advised patient chest x-ray does not show obvious pneumonia.  Patient is adamant that she knows she has pneumonia because she has had extremity times in the past.  Advised patient we will cover her for the possibility of pneumonia given her history.  I sent Augmentin, azithromycin and prednisone as requested.  Advised increasing rest and fluids.  Advised patient to go to the ED if she has any red flag signs and symptoms.  Red flag symptoms and ED precautions discussed at length with patient.  Advised her to go to ED if her pain is not adequately managed at home with her hydromorphone.  Advised ED if she has any pain in her chest or increased breathing difficulty.  Patient understanding and agreeable.    Final Clinical Impressions(s) / UC Diagnoses   Final diagnoses:  Lower respiratory infection  Shortness of breath  Malignant neoplasm of lung, unspecified laterality, unspecified part of lung ([place])     Discharge Instructions      -The x-ray does not show any clear evidence of pneumonia but given your history of recurrent pneumonia and lung cancer, I will treat you with antibiotics to normally help you as well as give you some prednisone since that normally helps you.  Continue use of your oxygen. - Follow-up with your PCP but if you do develop  a fever or you have increased pain in your chest/ribs or increased shortness of breath or are feeling worse or does not better in a few days you need to go to the ER for evaluation.  We discussed the possibility of something like a blood clot in your lungs so if things are worsening you need to not delay and go to the ER.   ED Prescriptions     Medication Sig Dispense Auth. Provider   predniSONE (DELTASONE) 50 MG tablet Take 1 tablet daily by mouth for 5 days. 5 tablet Laurene Footman B, PA-C   amoxicillin-clavulanate (AUGMENTIN) 875-125 MG tablet Take 1 tablet by mouth every 12 (twelve) hours for 7 days. 14 tablet Laurene Footman B, PA-C   azithromycin (ZITHROMAX) 250 MG tablet Take 1 tablet (250 mg total) by mouth daily. Take first 2 tablets together, then 1 every day until finished. 6 tablet Laurene Footman B, PA-C   predniSONE (DELTASONE) 20 MG tablet Take 2 tablets (  40 mg total) by mouth daily for 5 days. 10 tablet Gretta Cool      PDMP not reviewed this encounter.   Danton Clap, PA-C 08/14/20 1424    Laurene Footman B, PA-C 06/13/22 1400

## 2022-06-13 NOTE — ED Triage Notes (Addendum)
Pt states she has Stage 4 lung cancer and thinks she has pneumonia. Fatigue, shortness of breath, nausea, and diarrhea. Sx for past 2 days

## 2022-06-13 NOTE — Discharge Instructions (Addendum)
-  The x-ray does not show any clear evidence of pneumonia but given your history of recurrent pneumonia and lung cancer, I will treat you with antibiotics to normally help you as well as give you some prednisone since that normally helps you.  Continue use of your oxygen. - Follow-up with your PCP but if you do develop a fever or you have increased pain in your chest/ribs or increased shortness of breath or are feeling worse or does not better in a few days you need to go to the ER for evaluation.  We discussed the possibility of something like a blood clot in your lungs so if things are worsening you need to not delay and go to the ER.

## 2022-07-16 ENCOUNTER — Ambulatory Visit: Payer: Medicare Other

## 2022-07-22 ENCOUNTER — Telehealth: Payer: Self-pay | Admitting: Emergency Medicine

## 2022-07-22 ENCOUNTER — Encounter: Payer: Self-pay | Admitting: Emergency Medicine

## 2022-07-22 ENCOUNTER — Ambulatory Visit
Admission: EM | Admit: 2022-07-22 | Discharge: 2022-07-22 | Disposition: A | Discharge status: Home / Self Care | Payer: Medicare Other | Attending: Urgent Care | Admitting: Urgent Care

## 2022-07-22 DIAGNOSIS — M26621 Arthralgia of right temporomandibular joint: Secondary | ICD-10-CM | POA: Diagnosis not present

## 2022-07-22 DIAGNOSIS — M26609 Unspecified temporomandibular joint disorder, unspecified side: Secondary | ICD-10-CM

## 2022-07-22 MED ORDER — NAPROXEN SODIUM 550 MG PO TABS: 550.0000 mg | ORAL_TABLET | Freq: Two times a day (BID) | ORAL | 0 refills | Status: DC

## 2022-07-22 MED ORDER — BACLOFEN 10 MG PO TABS: 10.0000 mg | ORAL_TABLET | Freq: Three times a day (TID) | ORAL | 0 refills | Status: DC

## 2022-07-22 MED ORDER — BACLOFEN 10 MG PO TABS: 10.0000 mg | ORAL_TABLET | Freq: Three times a day (TID) | ORAL | 0 refills | Status: AC

## 2022-07-22 MED ORDER — NAPROXEN SODIUM 550 MG PO TABS: 550.0000 mg | ORAL_TABLET | Freq: Two times a day (BID) | ORAL | 0 refills | Status: AC

## 2022-07-22 NOTE — Telephone Encounter (Signed)
Pt called requesting prescription to be sent to [facility] in [place] due to [facility] not being able to fill her prescription.

## 2022-07-22 NOTE — ED Provider Notes (Signed)
[facility]    CSN: [id number] Arrival date & time: 07/22/22  1020      History   Chief Complaint Chief Complaint  Patient presents with   Otalgia   Sore Throat   Nausea    HPI Nichole Zhang is a 61 y.o. female.   61 year old female presents today due to concerns primarily of right ear pain.  She states has been persistent for the past 7 days.  She called her PCP who put her on Augmentin.  She has taken this as prescribed for the past 7 days but states her pain is worsening.  Patient does report pain to her jaw and temple as well.  She believes the jaw pain is her eustachian tube.  She has been using Flonase without relief.  Patient has a history of cancer and is primarily requesting antibiotics.  She states that Augmentin in combination with erythromycin are the only things that ever worked for her.  Patient is adamant that she has an ear infection.  She denies fever, but states she sweats occasionally.  She does have a history of hot flashes.  She was seen on 10/19 for what she states was pneumonia and despite having a negative chest x-ray, was discharged home on Augmentin and azithromycin.  Patient states it cleared up then and is requesting the same treatment again today.   Otalgia Sore Throat    Past Medical History:  Diagnosis Date   Cancer [facility])    stage IV lung    Patient Active Problem List   Diagnosis Date Noted   Chronic midline thoracic back pain 12/10/2021   Hot flashes 09/13/2021   Hypokalemia 08/06/2021   Chronic, continuous use of opioids 06/15/2021   Claustrophobia 06/15/2021   Requires continuous at home supplemental oxygen 06/15/2021   Adnexal mass 04/24/2021   Essential hypertension 03/18/2021   Hypercholesterolemia 03/18/2021   Situational depression 03/18/2021   Chronic nausea 07/25/2019   Chronic inflammatory arthritis 06/15/2019   COPD (chronic obstructive pulmonary disease) ([place]) 12/08/2018   Drug-induced osteoporosis  11/26/2018   History of diverticulitis 06/15/2018   Pes anserine bursitis 04/02/2018   Peripheral edema 05/15/2017   Autoimmune hepatitis ([place]) 04/27/2017   Transaminitis 04/27/2017   Convalescence after chemotherapy 04/24/2017   Metastatic primary lung cancer, left ([place]) 03/20/2017   Lung mass 03/13/2017   Malignant neoplasm metastatic to brain ([place]) 03/13/2017    Past Surgical History:  Procedure Laterality Date   BACK SURGERY     T12   CHOLECYSTECTOMY     EXTRACORPOREAL SHOCK WAVE LITHOTRIPSY Right 09/27/2021   Procedure: EXTRACORPOREAL SHOCK WAVE LITHOTRIPSY (ESWL);  Surgeon: Royston Cowper, MD;  Location: [facility] ORS;  Service: Urology;  Laterality: Right;   GALLBLADDER SURGERY      OB History   No obstetric history on file.      Home Medications    Prior to Admission medications   Medication Sig Start Date End Date Taking? Authorizing Provider  acetaminophen (TYLENOL) 650 MG CR tablet Take by mouth.    [provider]  ADULT ASPIRIN EC LOW STRENGTH PO INHALE 1 PUFF BY MOUTH INTO THE LUNGS ONCE DAILY    [provider]  ADULT ASPIRIN EC LOW STRENGTH PO Take 1 tablet by mouth daily.    [provider]  albuterol (VENTOLIN HFA) 108 (90 Base) MCG/ACT inhaler Inhale 2 puffs into the lungs every 4 (four) hours as needed. 08/29/21   Margarette Canada, NP  alendronate (FOSAMAX) 70 MG tablet  Take 70 mg by mouth once a week. 08/25/20   [provider]  ANORO ELLIPTA 62.5-25 MCG/INH AEPB Inhale 1 puff into the lungs daily. 09/10/20   [provider]  azithromycin (ZITHROMAX) 250 MG tablet Take 1 tablet (250 mg total) by mouth daily. Take first 2 tablets together, then 1 every day until finished. 06/13/22   Danton Clap, PA-C  baclofen (LIORESAL) 10 MG tablet Take 1 tablet (10 mg total) by mouth 3 (three) times daily for 7 days. 07/22/22 07/29/22  Toneshia Coello L, PA  Budeson-Glycopyrrol-Formoterol (BREZTRI AEROSPHERE) 160-9-4.8 MCG/ACT AERO  Inhale into the lungs. 11/19/21 11/19/22  [provider]  butalbital-acetaminophen-caffeine (FIORICET) 50-325-40 MG tablet Take 1 tablet by mouth every 6 (six) hours as needed. 01/18/21   [provider]  Calcium Carbonate-Vitamin D 600-400 MG-UNIT tablet Take 1 tablet by mouth daily.    [provider]  Cholecalciferol 25 MCG (1000 UT) capsule Take by mouth.    [provider]  dexamethasone (DECADRON) 0.5 MG tablet Take by mouth. 07/09/21   [provider]  dexamethasone (DECADRON) 0.5 MG tablet Take 0.5 mg by mouth daily. 07/09/21   [provider]  dexamethasone (DECADRON) 2 MG tablet Take by mouth. 01/26/21   [provider]  Docusate Sodium (DSS) 250 MG CAPS Take by mouth.    [provider]  fluticasone (FLONASE) 50 MCG/ACT nasal spray SMARTSIG:1-2 Spray(s) Both Nares Daily PRN 08/31/20   [provider]  fluticasone (FLONASE) 50 MCG/ACT nasal spray Place into the nose. 03/06/20   [provider]  gabapentin (NEURONTIN) 300 MG capsule SMARTSIG:1-2 Capsule(s) By Mouth Every Evening 08/31/20   [provider]  gabapentin (NEURONTIN) 300 MG capsule Take by mouth. 09/14/21 09/14/22  [provider]  HYDROcodone-acetaminophen (NORCO) 10-325 MG tablet Take by mouth. 09/26/21   [provider]  HYDROmorphone (DILAUDID) 4 MG tablet Take 4 mg by mouth every 4 (four) hours as needed. 08/25/20   [provider]  KLOR-CON M20 20 MEQ tablet Take 20 mEq by mouth 2 (two) times daily. 08/06/21   [provider]  losartan-hydrochlorothiazide (HYZAAR) 100-25 MG tablet Take 1 tablet by mouth daily. 03/13/21   [provider]  naproxen sodium (ANAPROX DS) 550 MG tablet Take 1 tablet (550 mg total) by mouth 2 (two) times daily with a meal for 7 days. 07/22/22 07/29/22  Latissa Frick L, PA  OLANZapine (ZYPREXA) 2.5 MG tablet Take 2.5 mg by mouth at bedtime. 08/27/20   [provider]  OLANZapine (ZYPREXA) 2.5 MG tablet Take 1 tablet by mouth at bedtime. 07/09/21   [provider]  OLANZapine (ZYPREXA) 5 MG tablet Take by mouth. 10/02/18 12/31/18  [provider]  ondansetron (ZOFRAN ODT) 4 MG disintegrating tablet Take 1 tablet (4 mg total) by mouth every 8 (eight) hours as needed for nausea or vomiting. 04/08/21   Coral Spikes, DO  pantoprazole (PROTONIX) 40 MG tablet Take by mouth. 01/18/21 01/18/22  [provider]  pantoprazole (PROTONIX) 40 MG tablet Take by mouth. 09/14/21 09/14/22  [provider]  potassium chloride (KLOR-CON) 10 MEQ tablet Take 20 mEq by mouth 2 (two) times daily. 09/10/21   [provider]  potassium chloride (MICRO-K) 10 MEQ CR capsule Take by mouth. 09/14/21 09/14/22  [provider]  predniSONE (DELTASONE) 50 MG tablet Take 1 tablet daily by mouth for 5 days. 06/13/22   Laurene Footman B, PA-C  simvastatin (ZOCOR) 10 MG tablet Take by mouth.  03/12/21   [provider]  simvastatin (ZOCOR) 10 MG tablet Take 1 tablet by mouth at bedtime. 09/14/21   [provider]  Spacer/Aero-Holding Chambers (AEROCHAMBER MV) inhaler Use as instructed 08/29/21   Margarette Canada, NP  tamsulosin (FLOMAX) 0.4 MG CAPS capsule Take 0.4 mg by mouth daily. 09/17/21   [provider]  tamsulosin (FLOMAX) 0.4 MG CAPS capsule Take by mouth. 09/17/21 09/17/22  [provider]  venlafaxine XR (EFFEXOR-XR) 75 MG 24 hr capsule Take 75 mg by mouth daily. 09/17/20   [provider]  Vitamin D, Ergocalciferol, (DRISDOL) 1.25 MG (50000 UT) CAPS capsule Take 1 capsule by mouth weekly for 12 weeks 10/12/18   [provider]  cloNIDine (CATAPRES) 0.1 MG tablet Take by mouth. 11/09/18 10/11/20  [provider]    Family History Family History  Problem Relation Age of Onset   Heart attack Mother    Hypertension Mother    Hypertension Father    Hyperlipidemia Father    Breast  cancer Neg Hx     Social History Social History   Tobacco Use   Smoking status: Never   Smokeless tobacco: Never  Vaping Use   Vaping Use: Never used  Substance Use Topics   Alcohol use: No   Drug use: No     Allergies   Patient has no known allergies.   Review of Systems Review of Systems  HENT:  Positive for ear pain.      Physical Exam Triage Vital Signs ED Triage Vitals  Enc Vitals Group     BP 07/22/22 1149 126/83     Pulse Rate 07/22/22 1149 93     Resp 07/22/22 1149 18     Temp 07/22/22 1149 98.1 F (36.7 C)     Temp Source 07/22/22 1149 Oral     SpO2 07/22/22 1150 97 %     Weight --      Height --      Head Circumference --      Peak Flow --      Pain Score 07/22/22 1148 7     Pain Loc --      Pain Edu? --      Excl. in [place]? --    No data found.  Updated Vital Signs BP 126/83 (BP Location: Left Arm)   Pulse 93   Temp 98.1 F (36.7 C) (Oral)   Resp 18   SpO2 97%   Visual Acuity Right Eye Distance:   Left Eye Distance:   Bilateral Distance:    Right Eye Near:   Left Eye Near:    Bilateral Near:     Physical Exam Vitals and nursing note reviewed.  Constitutional:      General: She is not in acute distress.    Appearance: She is well-developed. She is not ill-appearing, toxic-appearing or diaphoretic.  HENT:     Head: Normocephalic and atraumatic.     Jaw: Tenderness present.     Comments: Slight subluxation to TMJ on R    Right Ear: Tympanic membrane and ear canal normal. No drainage, swelling or tenderness. No middle ear effusion. Tympanic membrane is not erythematous.     Left Ear: Tympanic membrane and ear canal normal. No drainage, swelling or tenderness.  No middle ear effusion. Tympanic membrane is not erythematous.     Nose: No congestion or rhinorrhea.     Mouth/Throat:     Mouth: Mucous membranes are moist. No oral lesions.  Pharynx: Oropharynx is clear. Uvula midline. No pharyngeal swelling, oropharyngeal exudate,  posterior oropharyngeal erythema or uvula swelling.     Tonsils: No tonsillar exudate or tonsillar abscesses.  Eyes:     Extraocular Movements:     Right eye: Normal extraocular motion.     Left eye: Normal extraocular motion.     Conjunctiva/sclera: Conjunctivae normal.     Pupils: Pupils are equal, round, and reactive to light.  Neck:     Thyroid: No thyromegaly.  Cardiovascular:     Rate and Rhythm: Normal rate and regular rhythm.     Heart sounds: No murmur heard. Pulmonary:     Effort: Pulmonary effort is normal. No respiratory distress.     Breath sounds: Normal breath sounds. No stridor. No wheezing, rhonchi or rales.  Musculoskeletal:        General: No swelling.     Cervical back: Normal range of motion and neck supple.  Lymphadenopathy:     Cervical: No cervical adenopathy.  Skin:    General: Skin is warm and dry.     Capillary Refill: Capillary refill takes less than 2 seconds.  Neurological:     Mental Status: She is alert.  Psychiatric:        Mood and Affect: Mood normal.      UC Treatments / Results  Labs (all labs ordered are listed, but only abnormal results are displayed) Labs Reviewed - No data to display  EKG   Radiology No results found.  Procedures Procedures (including critical care time)  Medications Ordered in UC Medications - No data to display  Initial Impression / Assessment and Plan / UC Course  I have reviewed the triage vital signs and the nursing notes.  Pertinent labs & imaging results that were available during my care of the patient were reviewed by me and considered in my medical decision making (see chart for details).     R TMJ -discussed with patient that I see no signs of an ear infection.  Her TM is not red or bulging.  There is no fluid behind the TM.  She is already been on Augmentin for 1 week without improvement in her symptoms.  I do not feel that adding a erythromycin at this time will be in her best benefit.  She  does however have symptoms consistent with right TMJ arthralgia.  Her symptoms extend into the temple and the neck.  She is actively chewing gum in the office, which as I discussed with patient is a risk factor.  Would like to discharge patient home on Anaprox DS to take with food, and baclofen.  Encourage patient to continue with the warm moist compresses to her TMJ joint.  If no significant improvement noted in the past 4 days, she may contact our office.   Final Clinical Impressions(s) / UC Diagnoses   Final diagnoses:  Arthralgia of right temporomandibular joint     Discharge Instructions      You do not have an ear infection. You might have eustachian tube dysfunction which can cause some degree of ear pain.  You may continue using your Flonase.  I suspect however you would get significant relief from using a saline spray to flush the nasal passages. I also suspect much of your pain is likely coming from inflammation of your lateral pterygoid muscle. Please continue using a moist heating pad To the affected area.  Take the naproxen twice daily with food Take the baclofen 3 times daily.  This might make you slightly drowsy so take with caution. If your symptoms are not improving within the next 3 days, notify our office.   ED Prescriptions     Medication Sig Dispense Auth. Provider   baclofen (LIORESAL) 10 MG tablet Take 1 tablet (10 mg total) by mouth 3 (three) times daily for 7 days. 21 tablet Karissa Meenan L, PA   naproxen sodium (ANAPROX DS) 550 MG tablet Take 1 tablet (550 mg total) by mouth 2 (two) times daily with a meal for 7 days. 14 tablet Orey Moure L, [place]      PDMP not reviewed this encounter.   Chaney Malling, [place] 07/22/22 2053

## 2022-07-22 NOTE — Discharge Instructions (Addendum)
You do not have an ear infection. You might have eustachian tube dysfunction which can cause some degree of ear pain.  You may continue using your Flonase.  I suspect however you would get significant relief from using a saline spray to flush the nasal passages. I also suspect much of your pain is likely coming from inflammation of your lateral pterygoid muscle. Please continue using a moist heating pad To the affected area.  Take the naproxen twice daily with food Take the baclofen 3 times daily.  This might make you slightly drowsy so take with caution. If your symptoms are not improving within the next 3 days, notify our office.

## 2022-07-22 NOTE — ED Triage Notes (Signed)
Pt presents with bilateral ear pain, ST, and nausea x 1 week.

## 2023-02-14 ENCOUNTER — Encounter: Payer: Self-pay | Admitting: Emergency Medicine

## 2023-02-14 ENCOUNTER — Ambulatory Visit: Admission: EM | Admit: 2023-02-14 | Discharge: 2023-02-14 | Payer: Medicare Other

## 2023-02-14 DIAGNOSIS — R000 Tachycardia, unspecified: Secondary | ICD-10-CM | POA: Diagnosis not present

## 2023-02-14 DIAGNOSIS — J9601 Acute respiratory failure with hypoxia: Secondary | ICD-10-CM

## 2023-02-14 DIAGNOSIS — R110 Nausea: Secondary | ICD-10-CM | POA: Diagnosis not present

## 2023-02-14 DIAGNOSIS — I959 Hypotension, unspecified: Secondary | ICD-10-CM | POA: Diagnosis not present

## 2023-02-14 DIAGNOSIS — C3492 Malignant neoplasm of unspecified part of left bronchus or lung: Secondary | ICD-10-CM

## 2023-02-14 NOTE — ED Triage Notes (Signed)
Pt presents with nausea, SOB, bodyaches and cough that started this morning.

## 2023-02-14 NOTE — Discharge Instructions (Signed)

## 2023-02-14 NOTE — ED Provider Notes (Signed)
[facility]    CSN: [id number] Arrival date & time: 02/14/23  1001      History   Chief Complaint Chief Complaint  Patient presents with   Nausea   Shortness of Breath   Cough    HPI Nichole Zhang is a 62 y.o. female with stage IV NSCLC lung cancer (left lower lobe) and brain metastasis status post chemo/XRT. Discontinued cancer therapy in 2020. Has emphysema as well. Patient also has cancer related back pain and takes Dilaudid as well as Morphine. Has not taken any pain medication today and requests pain medicine for her back.  Patient is on palliative care.    Patient presents with 2 family members for shortness of breath, weakness, diaphoresis, fatigue, abdominal pain, right sided rib pain, right sided pleuritic pain, and increased SOB. Symptoms began this morning. Patient is on supplemental O2 and is currently using 3 L continuous.  Initial oxygen saturation is 82% on 3L O2.  She has history of recurrent pneumonia and believes she has pneumonia.  Patient is very fatigued and ill appearing. Family helps to provide some of the history. She is responsive but slowed, diaphoretic, pale and clammy.  Denies any associated chest pain, cough, urinary symptoms, leg swelling.  Additionally, patient denies any history of cardiac disease.  Denies any history of DVT or PE.   HPI  Past Medical History:  Diagnosis Date   Cancer (HCC)    stage IV lung    Patient Active Problem List   Diagnosis Date Noted   Chronic midline thoracic back pain 12/10/2021   Hot flashes 09/13/2021   Hypokalemia 08/06/2021   Chronic, continuous use of opioids 06/15/2021   Claustrophobia 06/15/2021   Requires continuous at home supplemental oxygen 06/15/2021   Adnexal mass 04/24/2021   Essential hypertension 03/18/2021   Hypercholesterolemia 03/18/2021   Situational depression 03/18/2021   Chronic nausea 07/25/2019   Chronic inflammatory arthritis 06/15/2019   COPD (chronic obstructive  pulmonary disease) (HCC) 12/08/2018   Drug-induced osteoporosis 11/26/2018   History of diverticulitis 06/15/2018   Pes anserine bursitis 04/02/2018   Peripheral edema 05/15/2017   Autoimmune hepatitis (HCC) 04/27/2017   Transaminitis 04/27/2017   Convalescence after chemotherapy 04/24/2017   Metastatic primary lung cancer, left (HCC) 03/20/2017   Lung mass 03/13/2017   Malignant neoplasm metastatic to brain (HCC) 03/13/2017     Past Surgical History:  Procedure Laterality Date   BACK SURGERY     T12   CHOLECYSTECTOMY     EXTRACORPOREAL SHOCK WAVE LITHOTRIPSY Right 09/27/2021   Procedure: EXTRACORPOREAL SHOCK WAVE LITHOTRIPSY (ESWL);  Surgeon: Orson Ape, MD;  Location: [facility] ORS;  Service: Urology;  Laterality: Right;   GALLBLADDER SURGERY      OB History   No obstetric history on file.      Home Medications    Prior to Admission medications   Medication Sig Start Date End Date Taking? Authorizing Provider  acetaminophen (TYLENOL) 650 MG CR tablet Take by mouth.    [provider]  ADULT ASPIRIN EC LOW STRENGTH PO INHALE 1 PUFF BY MOUTH INTO THE LUNGS ONCE DAILY    [provider]  ADULT ASPIRIN EC LOW STRENGTH PO Take 1 tablet by mouth daily.    [provider]  albuterol (VENTOLIN HFA) 108 (90 Base) MCG/ACT inhaler Inhale 2 puffs into the lungs every 4 (four) hours as needed. 08/29/21   Becky Augusta, NP  alendronate (FOSAMAX) 70 MG tablet Take 70 mg by mouth once a  week. 08/25/20   [provider]  Ernestina Patches 62.5-25 MCG/INH AEPB Inhale 1 puff into the lungs daily. 09/10/20   [provider]  azithromycin (ZITHROMAX) 250 MG tablet Take 1 tablet (250 mg total) by mouth daily. Take first 2 tablets together, then 1 every day until finished. 06/13/22   Shirlee Latch, PA-C  baclofen (LIORESAL) 10 MG tablet Take 1 tablet (10 mg total) by mouth 3 (three) times daily for 7 days. 07/22/22 07/29/22  Crain, Alphonzo Lemmings L, PA   butalbital-acetaminophen-caffeine (FIORICET) 50-325-40 MG tablet Take 1 tablet by mouth every 6 (six) hours as needed. 01/18/21   [provider]  Calcium Carbonate-Vitamin D 600-400 MG-UNIT tablet Take 1 tablet by mouth daily.    [provider]  Cholecalciferol 25 MCG (1000 UT) capsule Take by mouth.    [provider]  dexamethasone (DECADRON) 0.5 MG tablet Take by mouth. 07/09/21   [provider]  dexamethasone (DECADRON) 0.5 MG tablet Take 0.5 mg by mouth daily. 07/09/21   [provider]  dexamethasone (DECADRON) 2 MG tablet Take by mouth. 01/26/21   [provider]  Docusate Sodium (DSS) 250 MG CAPS Take by mouth.    [provider]  fluticasone (FLONASE) 50 MCG/ACT nasal spray SMARTSIG:1-2 Spray(s) Both Nares Daily PRN 08/31/20   [provider]  fluticasone (FLONASE) 50 MCG/ACT nasal spray Place into the nose. 03/06/20   [provider]  gabapentin (NEURONTIN) 300 MG capsule SMARTSIG:1-2 Capsule(s) By Mouth Every Evening 08/31/20   [provider]  gabapentin (NEURONTIN) 300 MG capsule Take by mouth. 09/14/21 09/14/22  [provider]  HYDROcodone-acetaminophen (NORCO) 10-325 MG tablet Take by mouth. 09/26/21   [provider]  HYDROmorphone (DILAUDID) 4 MG tablet Take 4 mg by mouth every 4 (four) hours as needed. 08/25/20   [provider]  KLOR-CON M20 20 MEQ tablet Take 20 mEq by mouth 2 (two) times daily. 08/06/21   [provider]  losartan-hydrochlorothiazide (HYZAAR) 100-25 MG tablet Take 1 tablet by mouth daily. 03/13/21   [provider]  naproxen sodium (ANAPROX DS) 550 MG tablet Take 1 tablet (550 mg total) by mouth 2 (two) times daily with a meal for 7 days. 07/22/22 07/29/22  Crain, Whitney L, PA  OLANZapine (ZYPREXA) 2.5 MG tablet Take 2.5 mg by mouth at bedtime. 08/27/20   [provider]  OLANZapine (ZYPREXA) 2.5 MG tablet Take 1 tablet by mouth  at bedtime. 07/09/21   [provider]  OLANZapine (ZYPREXA) 5 MG tablet Take by mouth. 10/02/18 12/31/18  [provider]  ondansetron (ZOFRAN ODT) 4 MG disintegrating tablet Take 1 tablet (4 mg total) by mouth every 8 (eight) hours as needed for nausea or vomiting. 04/08/21   Tommie Sams, DO  pantoprazole (PROTONIX) 40 MG tablet Take by mouth. 01/18/21 01/18/22  [provider]  pantoprazole (PROTONIX) 40 MG tablet Take by mouth. 09/14/21 09/14/22  [provider]  potassium chloride (KLOR-CON) 10 MEQ tablet Take 20 mEq by mouth 2 (two) times daily. 09/10/21   [provider]  predniSONE (DELTASONE) 50 MG tablet Take 1 tablet daily by mouth for 5 days. 06/13/22   Eusebio Friendly B, PA-C  simvastatin (ZOCOR) 10 MG tablet Take by mouth. 03/12/21   [provider]  simvastatin (ZOCOR) 10 MG tablet Take 1 tablet by mouth at bedtime. 09/14/21   [provider]  Spacer/Aero-Holding Chambers (AEROCHAMBER MV) inhaler Use as instructed 08/29/21   Becky Augusta, NP  tamsulosin (FLOMAX) 0.4 MG CAPS capsule Take 0.4 mg by mouth daily. 09/17/21   [provider]  venlafaxine XR (EFFEXOR-XR) 75 MG 24 hr capsule Take 75 mg by mouth daily. 09/17/20   [provider]  Vitamin D, Ergocalciferol, (DRISDOL) 1.25 MG (50000 UT) CAPS capsule Take 1 capsule by mouth weekly for 12 weeks 10/12/18   [provider]  cloNIDine (CATAPRES) 0.1 MG tablet Take by mouth. 11/09/18 10/11/20  [provider]    Family History Family History  Problem Relation Age of Onset   Heart attack Mother    Hypertension Mother    Hypertension Father    Hyperlipidemia Father    Breast cancer Neg Hx     Social History Social History   Tobacco Use   Smoking status: Never   Smokeless tobacco: Never  Vaping Use   Vaping Use: Never used  Substance Use Topics   Alcohol use: No   Drug use: No     Allergies   Patient has no known allergies.   Review  of Systems Review of Systems  Constitutional:  Positive for diaphoresis and fatigue. Negative for chills and fever.  HENT:  Negative for congestion.   Respiratory:  Positive for shortness of breath. Negative for cough.   Cardiovascular:  Negative for chest pain, palpitations and leg swelling.  Gastrointestinal:  Positive for abdominal pain and nausea. Negative for diarrhea and vomiting.  Genitourinary:  Negative for difficulty urinating, dysuria and frequency.  Musculoskeletal:  Positive for back pain (chronic). Negative for arthralgias and myalgias.  Skin:  Negative for rash.  Neurological:  Positive for weakness. Negative for headaches.  Hematological:  Negative for adenopathy.     Physical Exam Triage Vital Signs ED Triage Vitals  Enc Vitals Group     BP 08/14/20 1000 (!) 141/94     Pulse Rate 08/14/20 1000 90     Resp 08/14/20 1000 20     Temp 08/14/20 1000 98.1 F (36.7 C)     Temp Source 08/14/20 1000 Oral     SpO2 08/14/20 1000 96 %     Weight 08/14/20 0956 180 lb (81.6 kg)     Height 08/14/20 0956 5\' 7"  (1.702 m)     Head Circumference --      Peak Flow --      Pain Score 08/14/20 0955 10     Pain Loc --      Pain Edu? --      Excl. in GC? --    No data found.  Updated Vital Signs BP (!) 78/50 (BP Location: Left Arm)   Pulse (!) (P) 157   Temp 98.5 F (36.9 C) (Oral)   Resp 20   SpO2 (P) 92%       Physical Exam Vitals and nursing note reviewed.  Constitutional:      General: She is in acute distress.     Appearance: Normal appearance. She is ill-appearing, toxic-appearing and diaphoretic.  HENT:     Head: Normocephalic and atraumatic.     Nose: Nose normal. No rhinorrhea.  Eyes:     General: No scleral icterus.       Right eye: No discharge.        Left eye: No discharge.     Conjunctiva/sclera: Conjunctivae normal.  Cardiovascular:     Rate and Rhythm: Regular rhythm. Tachycardia present.     Heart sounds: Normal heart sounds.  Pulmonary:      Effort: Respiratory distress present.  Breath sounds: No wheezing, rhonchi or rales.     Comments: Reduced breath sounds bilateral lower lobes. Chest:     Chest wall: No tenderness.  Abdominal:     Palpations: Abdomen is soft.     Tenderness: There is abdominal tenderness (generalized). There is guarding (throughout).  Musculoskeletal:     Cervical back: Neck supple.     Right lower leg: No edema.     Left lower leg: No edema.  Neurological:     General: No focal deficit present.     Mental Status: She is lethargic.     Motor: No weakness.     Gait: Gait normal.  Psychiatric:        Mood and Affect: Mood normal.        Behavior: Behavior is slowed.      UC Treatments / Results  Labs (all labs ordered are listed, but only abnormal results are displayed) Labs Reviewed - No data to display   EKG   Radiology No results found.   Procedures Procedures (including critical care time)  Medications Ordered in UC Medications - No data to display  Initial Impression / Assessment and Plan / UC Course  I have reviewed the triage vital signs and the nursing notes.  Pertinent labs & imaging results that were available during my care of the patient were reviewed by me and considered in my medical decision making (see chart for details).   62 year-old female with history of stage IV lung cancer (specifically mass of the left lower lobe)  presenting for onset of fatigue, shortness of breath, right-sided rib pain and pleuritic pain on the right side, sweats,  nausea, and abdominal pain since this morning.  Family is presenting with the patient and helping to provide the history since she is in distress.  Blood pressure 78/50.  Pulse ranges between 139-157 bpm.  Oxygen is 82% on 3 L oxygen.   She is in acute distress, diaphoretic, pale and toxic appearing.  Breath sounds decreased in the lower lobes, but no wheezes, rales, or rhonchi.    Patient given duoneb and placed on 10 L  Oxygen non-rebreather. O2 saturation increases to 90%.   Discussed with patient and family that her vitals signs meet sepsis criteria and I have concerns that she has pneumonia. Needs immediate ED evaluation and I expect she will be admitted. They are agreeable to go to ED. Not safe for PV. EMS contacted within the first 30 min of visit.   Paramedics arrived and performed EKG, obtained IV access and started on IV fluids. Delayed transport of patient to ED since there was reportedly another (more unstable) patient in the building requiring immediate transport to ED.  Report given to EMS upon arrival. Patient being transported to [facility] ED. Leaving in stable condition.  Final Clinical Impressions(s) / UC Diagnoses   Final diagnoses:  Acute respiratory failure with hypoxia (HCC)  Nausea  Hypotension, unspecified hypotension type  Tachycardia  Primary malignant neoplasm of left lung metastatic to other site [facility])     Discharge Instructions      You have been advised to follow up immediately in the emergency department for concerning signs.symptoms. If you declined EMS transport, please have a family member take you directly to the ED at this time. Do not delay. Based on concerns about condition, if you do not follow up in th e ED, you may risk poor outcomes including worsening of condition, delayed treatment and potentially life threatening issues.  If you have declined to go to the ED at this time, you should call your PCP immediately to set up a follow up appointment.  Go to ED for red flag symptoms, including; fevers you cannot reduce with Tylenol/Motrin, severe headaches, vision changes, numbness/weakness in part of the body, lethargy, confusion, intractable vomiting, severe dehydration, chest pain, breathing difficulty, severe persistent abdominal or pelvic pain, signs of severe infection (increased redness, swelling of an area), feeling faint or passing out, dizziness, etc. You should  especially go to the ED for sudden acute worsening of condition if you do not elect to go at this time.      ED Prescriptions   None    PDMP not reviewed this encounter.     Shirlee Latch, PA-C 02/14/23 1410

## 2023-02-14 NOTE — ED Notes (Signed)
Patient is being discharged from the Urgent Care and sent to the Emergency Department via EMS . Per Algis Greenhouse Michiel Cowboy, [place], patient is in need of higher level of care due to acute respiratory failure with hypoxia, hypotension, and tachycardia. Patient is aware and verbalizes understanding of plan of care.  Vitals:   02/14/23 1032 02/14/23 1040  BP: (!) 78/50   Pulse:  (!) (P) 157  Resp:    Temp:    SpO2: (!) 86% (!) (P) 89%

## 2023-04-13 IMAGING — CR DG CHEST 2V
2 series · 2 of 2 positions shown · non-contrast
Comparison: 08/29/2021 chest x-ray.

CLINICAL DATA: worsening cough, history of COPD, history of lung
cancer in left lung- gets PNA in this area frequently

EXAM:
CHEST - 2 VIEW

[chest pa]
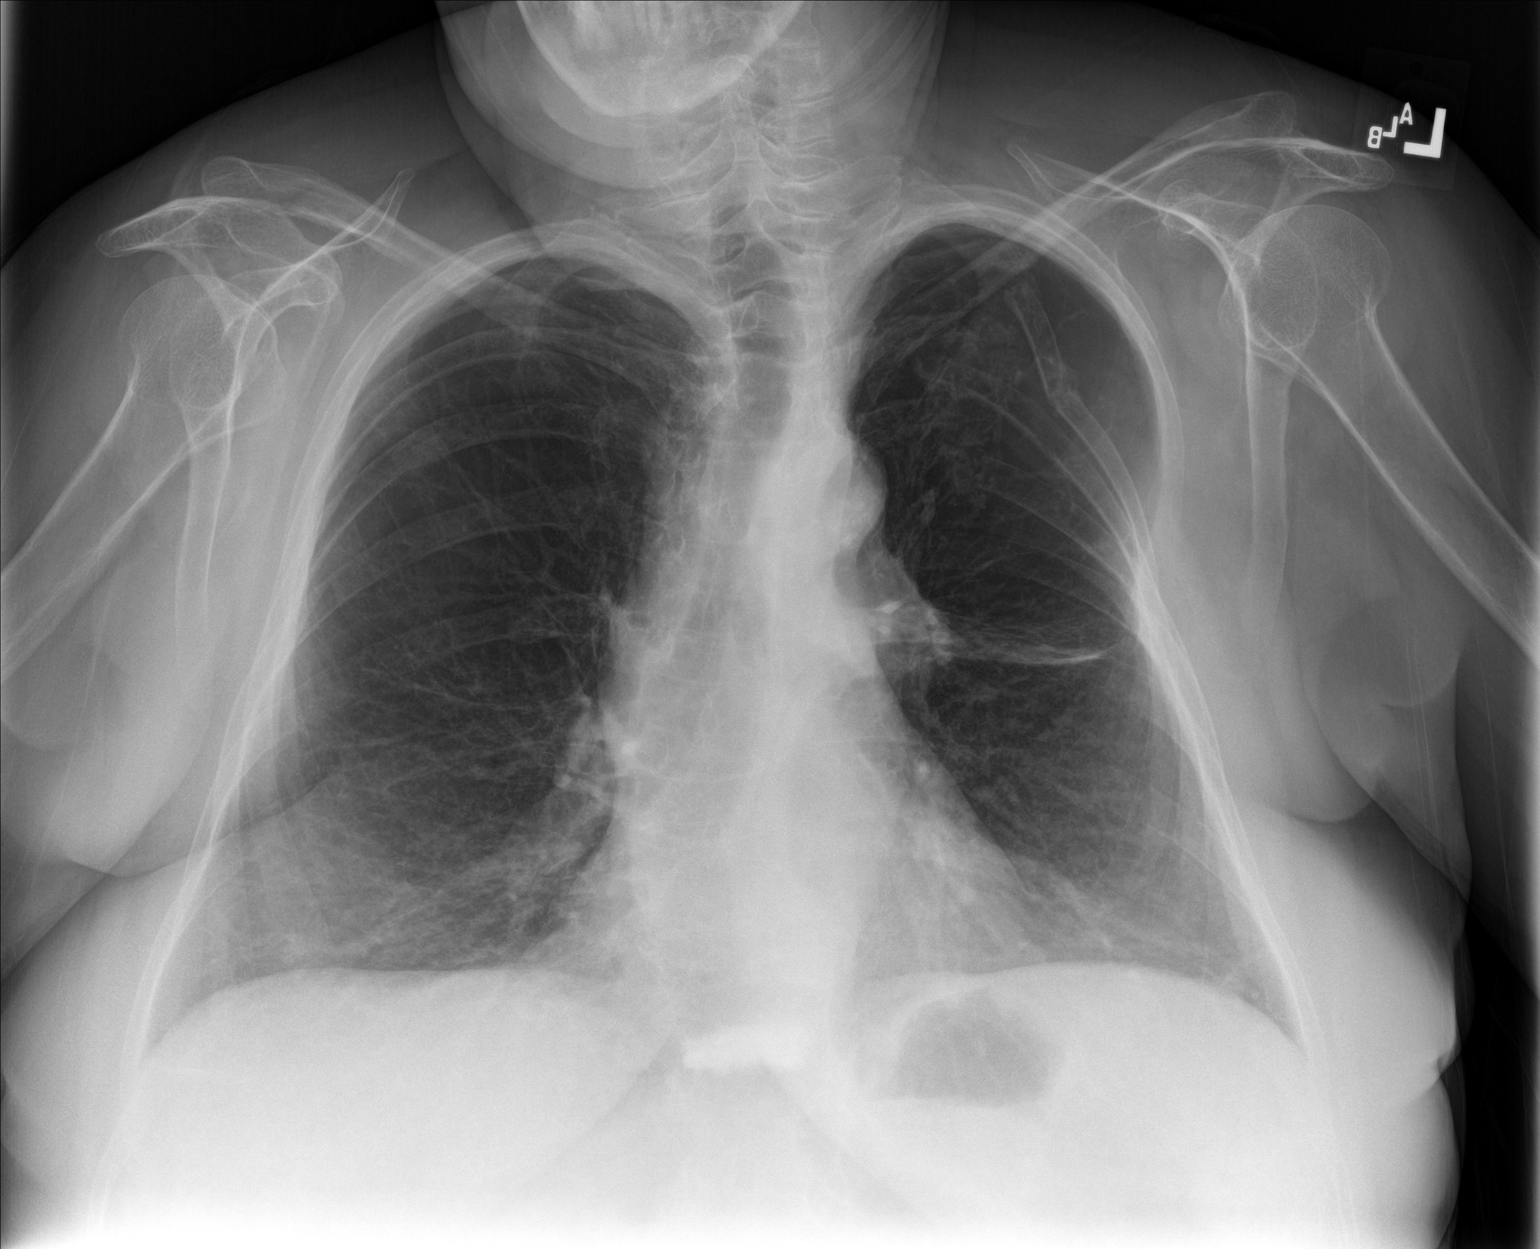

[chest lat]
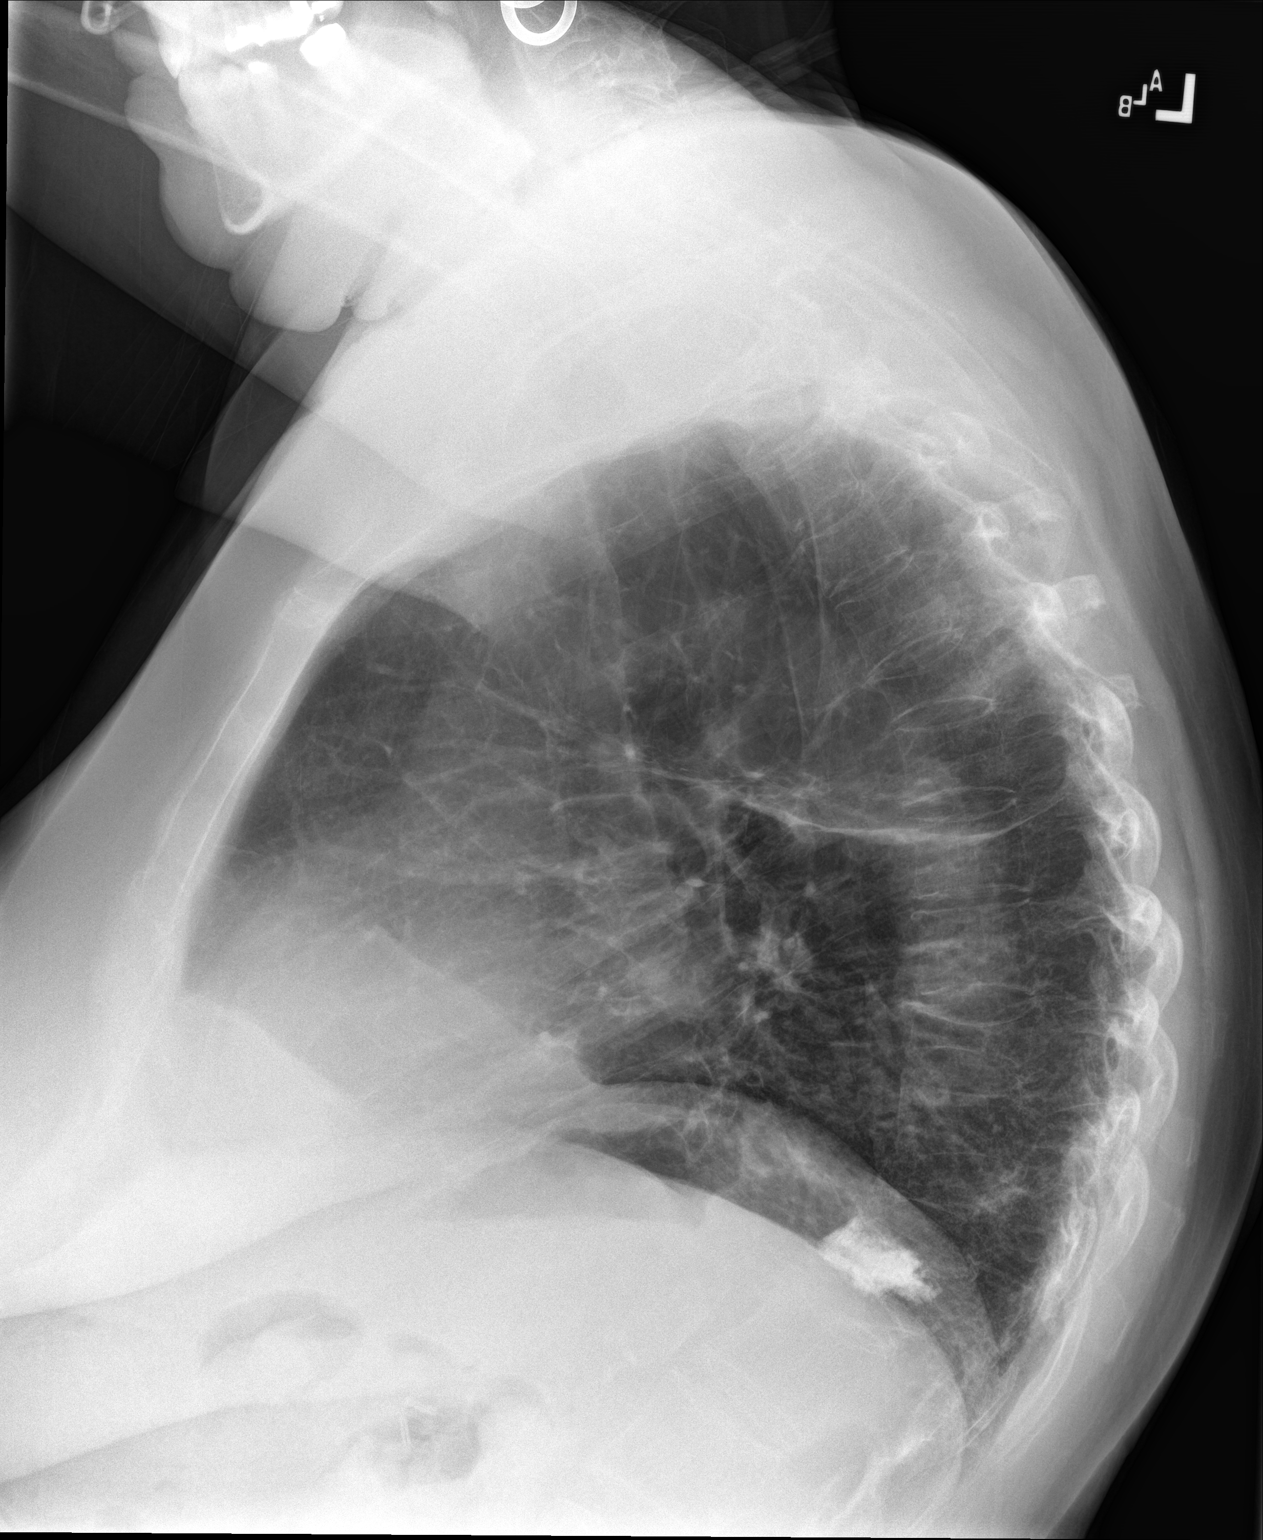

[2 of 2 positions shown; findings below may reference images not displayed]

FINDINGS: Chronic left midlung subsegmental atelectasis/scar. Mild streaky
bibasilar opacities. No visible pleural effusions or pneumothorax.
Cardiomediastinal silhouette is similar to prior. Remote left-sided
rib fractures and compression fractures of the thoracic spine. Prior
lower thoracic kyphoplasty.
IMPRESSION: Mild streaky bibasilar opacities, which could represent atelectasis
and/or pneumonia.

## 2023-05-11 IMAGING — CR DG CHEST 2V
2 series · 2 of 2 positions shown · non-contrast
Comparison: 12/10/2021.

CLINICAL DATA: Productive cough, fever, history of lung cancer.

EXAM:
CHEST - 2 VIEW

[chest pa]
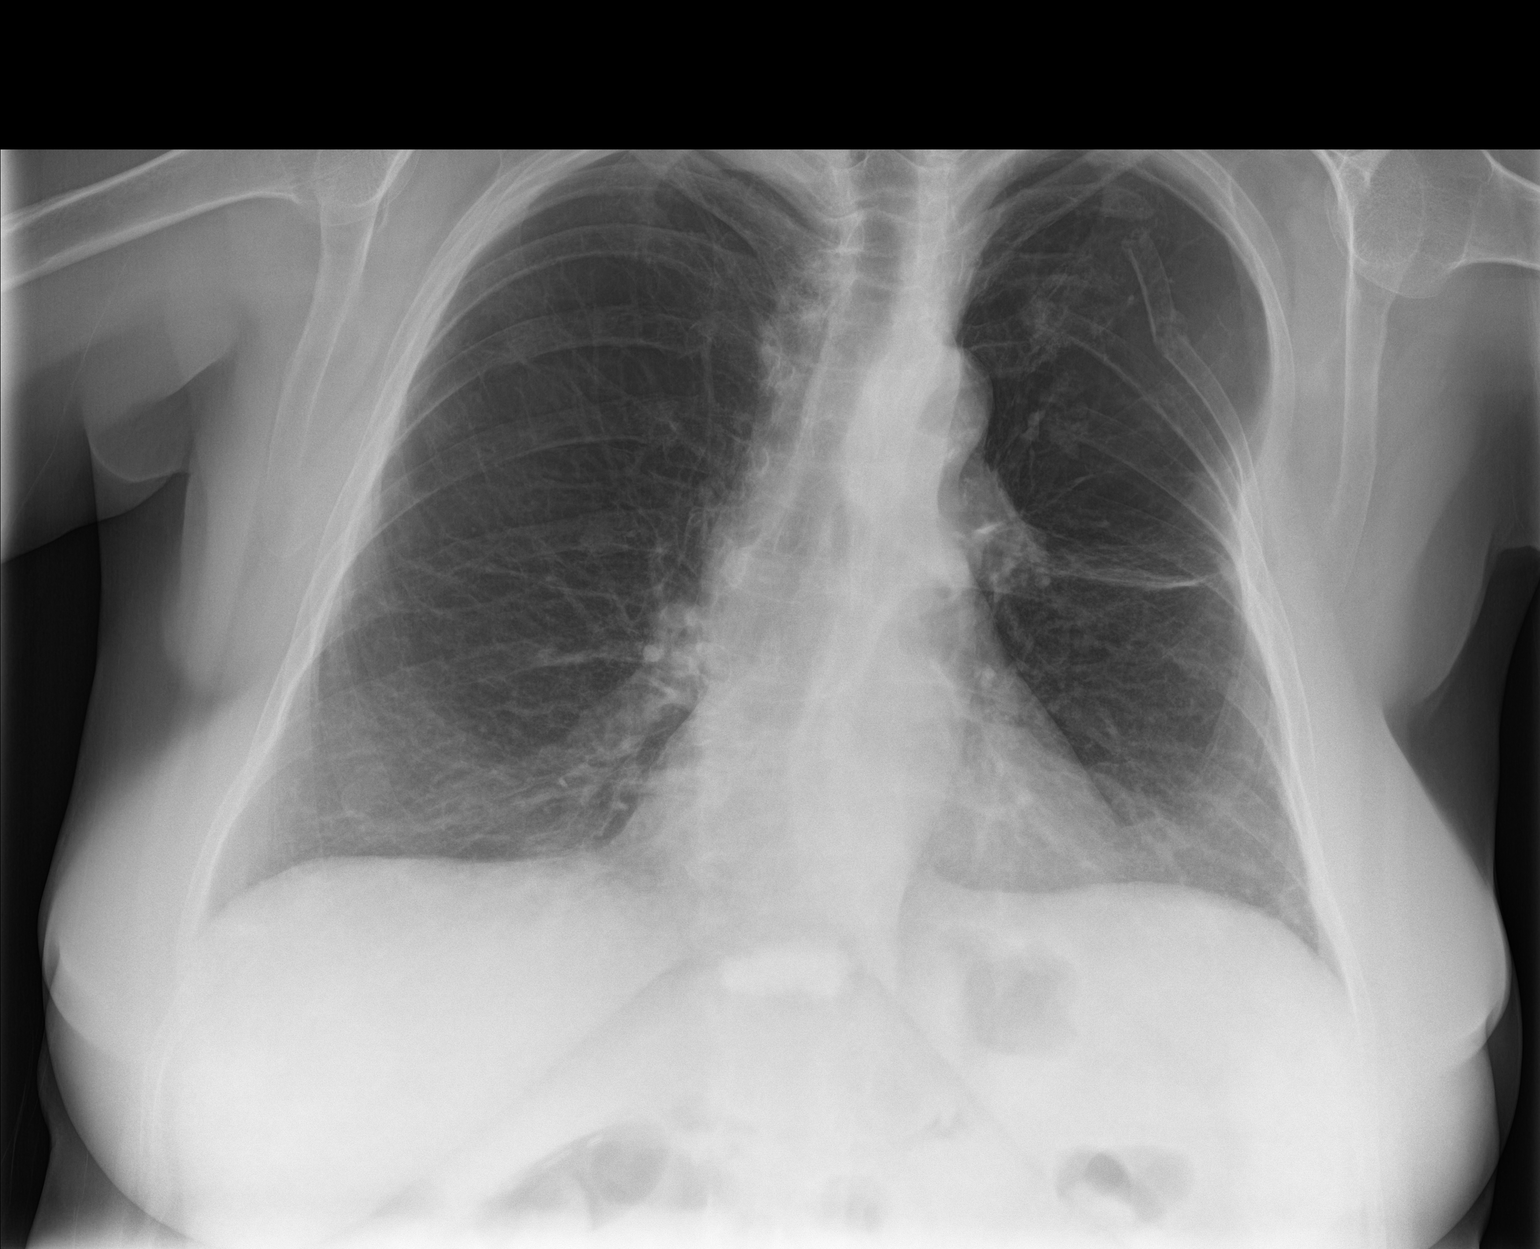

[chest lat]
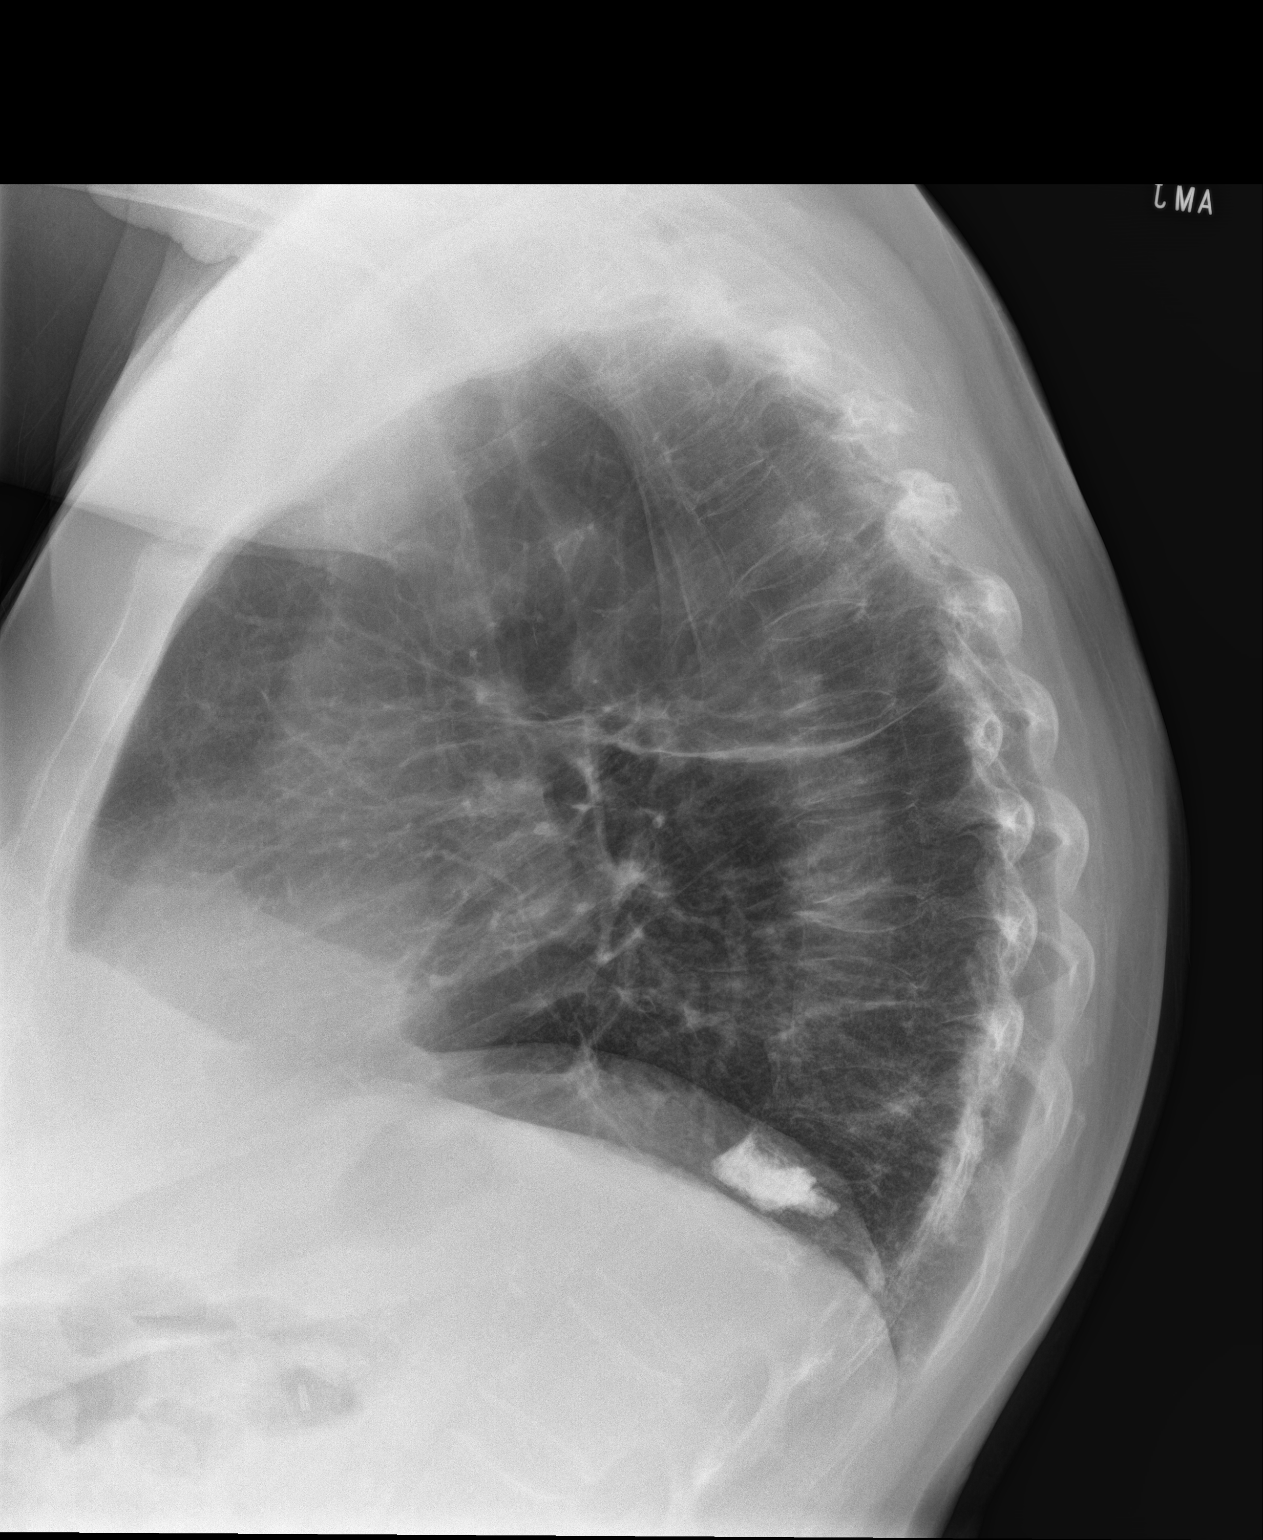

[2 of 2 positions shown; findings below may reference images not displayed]

FINDINGS: Trachea is midline. Heart size stable. Linear scarring in the left
perihilar region. Minimal streaky scarring in the lung bases. Lungs
are otherwise clear. No pleural fluid. Old upper left rib fractures.
Thoracic spine compression fractures, as before. Lower thoracic
vertebral body augmentation.
IMPRESSION: No acute findings.
